# Patient Record
Sex: Male | Born: 1980 | Hispanic: Yes | Marital: Single | Smoking: Current every day smoker
Health system: Southern US, Community
[De-identification: ages and names within clinical notes are randomized; demographics above are authoritative.]

## PROBLEM LIST (undated history)

## (undated) DIAGNOSIS — M109 Gout, unspecified: Secondary | ICD-10-CM

## (undated) DIAGNOSIS — I219 Acute myocardial infarction, unspecified: Secondary | ICD-10-CM

## (undated) DIAGNOSIS — F32A Depression, unspecified: Secondary | ICD-10-CM

## (undated) DIAGNOSIS — F419 Anxiety disorder, unspecified: Secondary | ICD-10-CM

## (undated) DIAGNOSIS — E785 Hyperlipidemia, unspecified: Secondary | ICD-10-CM

## (undated) HISTORY — PX: LITHOTRIPSY: SUR834

## (undated) HISTORY — PX: ABSCESS DRAINAGE: SHX1119

---

## 2014-12-23 ENCOUNTER — Emergency Department
Admission: EM | Admit: 2014-12-23 | Discharge: 2014-12-23 | Disposition: A | Discharge status: Home / Self Care | Payer: Charity | Attending: Emergency Medicine | Admitting: Emergency Medicine

## 2014-12-23 ENCOUNTER — Emergency Department: Payer: Charity

## 2014-12-23 DIAGNOSIS — S61412A Laceration without foreign body of left hand, initial encounter: Secondary | ICD-10-CM | POA: Insufficient documentation

## 2014-12-23 DIAGNOSIS — F17200 Nicotine dependence, unspecified, uncomplicated: Secondary | ICD-10-CM | POA: Insufficient documentation

## 2014-12-23 DIAGNOSIS — W01198A Fall on same level from slipping, tripping and stumbling with subsequent striking against other object, initial encounter: Secondary | ICD-10-CM | POA: Insufficient documentation

## 2014-12-23 DIAGNOSIS — S61011A Laceration without foreign body of right thumb without damage to nail, initial encounter: Secondary | ICD-10-CM | POA: Insufficient documentation

## 2014-12-23 DIAGNOSIS — W25XXXA Contact with sharp glass, initial encounter: Secondary | ICD-10-CM

## 2014-12-23 DIAGNOSIS — Z23 Encounter for immunization: Secondary | ICD-10-CM | POA: Insufficient documentation

## 2014-12-23 HISTORY — DX: Gout, unspecified: M10.9

## 2014-12-23 MED ORDER — HYDROCODONE-ACETAMINOPHEN 5-325 MG PO TABS
1.0000 | ORAL_TABLET | Freq: Four times a day (QID) | ORAL | Status: DC | PRN
Start: 2014-12-23 — End: 2015-11-29

## 2014-12-23 MED ORDER — TETANUS-DIPHTH-ACELL PERTUSSIS 5-2.5-18.5 LF-MCG/0.5 IM SUSP
0.5000 mL | Freq: Once | INTRAMUSCULAR | Status: AC
Start: 2014-12-23 — End: 2014-12-23
  Administered 2014-12-23: 0.5 mL via INTRAMUSCULAR
  Filled 2014-12-23: qty 0.5

## 2014-12-23 NOTE — Discharge Instructions (Signed)
Laceration, Sutures    You have been treated for a laceration (cut).    Follow up with your doctor OR come back here OR go to the nearest Emergency Department to have your sutures (stitches) taken out. Sutures should be taken out in:   10 days.    Use the following wound care instructions:   Keep the wound clean and dry for the next 24 hours. You can wash the wound gently with soap and water.   DO NOT allow your wound to soak in water (don't do the dishes or go swimming, for example). You can shower, but do not rub your stitches too hard. Let the wound dry before putting another bandage on.   Take off old dressings every day. Then put on a clean, dry dressing.   If the bandage sticks to the wound, slightly moisten it with water. This way, it can come off more easily.   You can wash the wound gently with soap and water. To help remove a scab, cleanse the area with a mixture of half hydrogen peroxide and half water. This will also help [place] to take out the sutures later.   Allow the area to dry completely before putting on a new bandage.   Unless you receive instructions not to do so, you can place a thin layer of antibiotic ointment over the wound. You can buy Polysporin, Bacitracin, or Neosporin at the store. Neosporin can sometimes cause irritation to your skin. If this happens, stop using it and switch to another topical (surface) antibiotic.   If needed, put a clean, dry bandage over the wound to protect it.    Keep the affected area elevated (lifted) for the next 24 hours. This will decrease swelling and pain. You may also want to put ice on the area. By applying ice to the affected area, swelling and pain can be reduced. Place some ice cubes in a re-sealable (Ziploc) bag and add some water. Put a thin washcloth between the bag and the skin. Apply the ice bag to the area for at least 20 minutes. Do this at least 4 times per day. It is OK to use the ice more frequently and for longer periods of  time. DO NOT APPLY ICE DIRECTLY TO THE SKIN!    If you had a local anesthetic, it will wear off in about 2 hours. Until then, be careful not to hurt yourself because of having less feeling in the area.    YOU SHOULD SEEK MEDICAL ATTENTION IMMEDIATELY, EITHER HERE OR AT THE NEAREST EMERGENCY DEPARTMENT, IF ANY OF THE FOLLOWING OCCURS:   You see redness or swelling.   There are red streaks going up from the injured area.   The wound smells bad or has a lot of drainage.   You have fever (temperature higher than 100.51F / 38C), chills, worse pain and / or swelling.            Laceration, General Wound Care    Use the following wound care instructions for your laceration (cut):   Keep the wound clean and dry for the next 24 hours. Avoid excessive moisture. You can wash the wound gently with soap and water. Then apply a dry bandage.   DO NOT allow your wound to soak in water (don't do the dishes or go swimming, for example). You can shower, but do not rub your stitches too hard. Let the wound dry before putting another bandage on.   Take off old  dressings every day. Then put on a clean, dry dressing.   If the dressing sticks to the wound, slightly moisten it with water. This way, it can come off more easily.   To help remove a scab, cleanse the area with a mixture of half hydrogen peroxide and half water. This will also help [place] to take out the sutures when they are ready to be taken out.   Let the area dry thoroughly.   Unless you receive instructions not to do so, you can place a thin layer of antibiotic ointment over the wound. You can buy Polysporin, Bacitracin, or Neosporin at the store. Neosporin can sometimes cause irritation to your skin. If this happens, stop using it and switch to another topical (surface) antibiotic.   If needed, put a clean, dry bandage over the wound to protect it.    Keep the injured area elevated (lifted) for the next 24 hours. This will decrease swelling and pain. You  may also want to put ice on the area. Place some ice cubes in a re-sealable (Ziploc) bag and add some water. Put a thin washcloth between the bag and the skin. Apply the ice bag to the area for at least 20 minutes. Do this at least 4 times per day. It is okay to do this more often than directed. You can also do it for longer than directed. NEVER APPLY ICE DIRECTLY TO THE SKIN.    If you had a local anesthetic, it will wear off in about 2 hours. Until then, be careful not to hurt yourself because of having less feeling in the area.    Not all lacerations (cuts) will need antibiotics. Your doctor may have decided that you need antibiotics to prevent an infection. Be sure to fill the prescription and take all medicines as directed.    If your doctor gave you a prescription for pain medicine, fill the prescription and use the medicine as directed.    YOU SHOULD SEEK MEDICAL ATTENTION IMMEDIATELY, EITHER HERE OR AT THE NEAREST EMERGENCY DEPARTMENT, IF ANY OF THE FOLLOWING OCCURS:   You see redness or swelling.   There are red streaks or there is redness around the wound.   The wound smells bad or has a lot of drainage.   You have fever (temperature higher than 100.62F or 38C), chills, worse pain and / or swelling.    Tetanus Booster    You have been given an immunization booster shot against tetanus and diphtheria and possibly pertussis (if you received Tdap).    This shot should protect you from tetanus for at least 5 to10 years.    Tetanus is a rare disease (fewer than 50 cases per year). It can be completely prevented with immunizations. Almost any wound can be infected with tetanus, but it is more likely with certain types (crush wounds, deep puncture wounds, and contaminated or dirty wounds).    Remember to tell your primary care physician that you received the tetanus booster shot today so that the information can be entered into you medical records.    As with any immunization, you can expect some  discomfort and redness at the injection site.    YOU SHOULD SEEK MEDICAL ATTENTION IMMEDIATELY, EITHER HERE OR AT THE NEAREST EMERGENCY DEPARTMENT, IF ANY OF THE FOLLOWING OCCURS:   You develop a fever (temperature higher than 100.62F / 38C).   You have increased swelling, redness or pain.   You see red streaks starting up the arm.  [name] [name]  [id number]  [id number]  [phone]  12/23/2014    Discharge Instructions    As always, you are the most important factor in your recovery.  Please follow these instructions carefully.  If you have problems that we have not discussed, CALL OR VISIT YOUR DOCTOR RIGHT AWAY.     If you can't reach your doctor, return to the emergency department.    I Marge Duncans understand the written and discussed instructions.  My questions have been answered.  I acknowledge receipt of these instructions.     Patient or responsible person:         Patient's Signature               Physician or Nurse

## 2014-12-23 NOTE — ED Provider Notes (Signed)
[facility] EMERGENCY DEPARTMENT APP H&P         CLINICAL SUMMARY      THIS CHART HAS BEEN COMPLETED AND IS READY TO SIGN.      Diagnosis:    .     Final diagnoses:   Thumb laceration, right, initial encounter   Hand laceration, left, initial encounter   Injury from broken glass, initial encounter   Need for tetanus booster         MDM Notes:        MDM  Number of Diagnoses or Management Options  Hand laceration, left, initial encounter: new, needed workup  Injury from broken glass, initial encounter: new, needed workup  Need for tetanus booster:   Thumb laceration, right, initial encounter: new, needed workup     Amount and/or Complexity of Data Reviewed  Tests in the radiology section of CPT: ordered and reviewed  Discussion of test results with the performing providers: yes  Discuss the patient with other providers: yes  Independent visualization of images, tracings, or specimens: yes        Disposition:           ED Disposition     Discharge Marge Duncans discharge to home/self care.    Condition at disposition: Stable                      CLINICAL INFORMATION        HPI:      Chief Complaint: Laceration  .    Chanc [name] [name] is a 34 y.o. male who presents with c/o b/l hand lacerations after pt fell into a glass table tonight.  +bleeding and pain last night but did not want medical attention; awoke to day and realized he needed medical attention.  +open wounds; bleeding controlled w/ pressure dressings.  Pain minor; sharp; worse w/ palpation of wounds.     @Nursing  (triage) note reviewed for the following pertinent information:    bilateral hand lacerations from a broken glass table.  happened 2am.  multi abrasions and lacerations noted.  last Td unk      History obtained from: Patient      ROS:      Positive and negative ROS elements as per HPI.  All other systems reviewed and negative.      Physical Exam:      Pulse 88  BP 111/78 mmHg  Resp 18  SpO2 99 %  Temp 98.4 F (36.9  C)    Physical Exam   Constitutional: He is oriented to person, place, and time. He appears well-developed and well-nourished. No distress.   HENT:   Head: Normocephalic and atraumatic.   Right Ear: External ear normal.   Left Ear: External ear normal.   Eyes: Conjunctivae and EOM are normal. Pupils are equal, round, and reactive to light.   Cardiovascular: Normal rate, regular rhythm and intact distal pulses.    Pulmonary/Chest: Effort normal. No respiratory distress.   Musculoskeletal: Normal range of motion.        Left hand: He exhibits bony tenderness and laceration. He exhibits normal range of motion, normal capillary refill and no deformity. Decreased sensation noted.        Hands:  Neurological: He is alert and oriented to person, place, and time. He has normal strength. Coordination normal.   Skin: Skin is warm and dry. No rash noted. He is not diaphoretic. No erythema. No pallor.   Psychiatric: He has a normal mood  and affect. His behavior is normal. Judgment and thought content normal. His speech is not slurred. Cognition and memory are normal. He is communicative. He is attentive.   Nursing note and vitals reviewed.                  PAST HISTORY        Primary Care Provider: Christa See, MD        PMH/PSH:    .     Past Medical History   Diagnosis Date   . Gout        He has no past surgical history on file.      Social/Family History:      He reports that he has been smoking.  He does not have any smokeless tobacco history on file. He reports that he drinks alcohol. He reports that he does not use illicit drugs.    History reviewed. No pertinent family history.      Listed Medications on Arrival:    .     There are no discharge medications for this patient.     Allergies: He has No Known Allergies.            VISIT INFORMATION        Clinical Course in the ED:            Medications Given in the ED:    .     ED Medication Orders     Start Ordered     Status Ordering Provider    12/23/14 1043  12/23/14 1042  tetanus-diphth-acell pertussis (BOOSTRIX) injection 0.5 mL   Once     Route: Intramuscular  Ordered Dose: 0.5 mL     Last MAR action:  Given Velton Roselle L            Procedures:      Lac Repair  Date/Time: 12/24/2014 12:09 AM  Performed by: Tivis Ringer, Sohail Capraro L  Authorized by: Lenord Fellers    Consent:     Consent obtained:  Verbal    Consent given by:  Patient    Risks discussed:  Pain, infection, poor cosmetic result, poor wound healing, need for additional repair and nerve damage    Alternatives discussed:  No treatment  Anesthesia (see MAR for exact dosages):     Anesthesia method:  Local infiltration  Laceration details:     Location:  Hand    Hand location:  L palm    Length (cm):  3  Repair type:     Repair type:  Simple  Pre-procedure details:     Preparation:  Patient was prepped and draped in usual sterile fashion  Exploration:     Hemostasis achieved with:  Epinephrine and direct pressure    Wound exploration: wound explored through full range of motion and entire depth of wound probed and visualized      Wound extent: nerve damage    Treatment:     Area cleansed with:  Betadine    Amount of cleaning:  Standard    Irrigation solution:  Sterile saline    Irrigation method:  Pressure wash  Skin repair:     Repair method:  Sutures    Suture size:  5-0    Suture material:  Nylon    Suture technique:  Simple interrupted    Number of sutures:  4  Approximation:     Approximation:  Close    Vermilion border: well-aligned    Post-procedure details:  Dressing:  Bulky dressing, sterile dressing and antibiotic ointment    Patient tolerance of procedure:  Tolerated well, no immediate complications  Lac Repair  Date/Time: 12/24/2014 12:10 AM  Performed by: Tivis Ringer, Perry Brucato L  Authorized by: Lenord Fellers    Consent:     Consent obtained:  Verbal    Consent given by:  Patient    Risks discussed:  Infection, pain, poor cosmetic result and poor wound healing    Alternatives discussed:  No treatment  Anesthesia  (see MAR for exact dosages):     Anesthesia method:  Local infiltration    Local anesthetic:  Lidocaine 2% WITH epi  Laceration details:     Location:  Finger    Finger location:  R thumb    Length (cm):  1  Repair type:     Repair type:  Simple  Pre-procedure details:     Preparation:  Patient was prepped and draped in usual sterile fashion and imaging obtained to evaluate for foreign bodies  Exploration:     Hemostasis achieved with:  Epinephrine and direct pressure    Wound exploration: wound explored through full range of motion and entire depth of wound probed and visualized      Wound extent: muscle damage    Treatment:     Area cleansed with:  Betadine    Amount of cleaning:  Standard    Irrigation solution:  Sterile saline    Irrigation method:  Pressure wash  Skin repair:     Repair method:  Sutures    Suture size:  5-0    Suture material:  Nylon    Suture technique:  Simple interrupted    Number of sutures:  1  Approximation:     Approximation:  Close    Vermilion border: well-aligned    Post-procedure details:     Dressing:  Sterile dressing and antibiotic ointment    Patient tolerance of procedure:  Tolerated well, no immediate complications        Interpretations:                  RESULTS        Lab Results:      Results     ** No results found for the last 24 hours. **              Radiology Results:      XR Hand Right PA Lateral And Oblique   Final Result    No fracture or foreign body in either hand.      Bosie Helper, MD    12/23/2014 12:37 PM         Hand Left PA Lateral and Oblique   Final Result    No fracture or foreign body in either hand.      Bosie Helper, MD    12/23/2014 12:37 PM                     Scribe Attestation:      No scribe involved in the care of this patient                             Erick Blinks, PA  12/23/14 1216    Erick Blinks, [place]  12/24/14 [phone]

## 2014-12-23 NOTE — Special Discharge Instructions (Cosign Needed)
**   IT WILL BE IMPORTANT TO FOLLOW UP WITH A HAND SPECIALIST WITHIN THE NEXT WEEK TO EVALUATE YOUR NUMBNESS AND DETERMINE IF YOU HAVE A NERVE INJURY TO YOUR LEFT HAND. **

## 2014-12-23 NOTE — ED Provider Notes (Signed)
Attending Note:     The patient was seen and examined by the mid-level provider. I agree with the plan as it was presented to me.   - Casson Catena H. Joseph Art, MD    Diagnosis:   1. Thumb laceration, right, initial encounter    2. Hand laceration, left, initial encounter    3. Injury from broken glass, initial encounter    4. Need for tetanus booster      Disposition:   ED Disposition     Discharge Marge Duncans discharge to home/self care.    Condition at disposition: Stable                  Lenord Fellers, MD  12/26/14 [phone]

## 2015-01-03 ENCOUNTER — Emergency Department: Payer: Charity

## 2015-01-03 ENCOUNTER — Emergency Department
Admission: EM | Admit: 2015-01-03 | Discharge: 2015-01-03 | Disposition: A | Discharge status: Home / Self Care | Payer: Charity | Attending: Emergency Medicine | Admitting: Emergency Medicine

## 2015-01-03 DIAGNOSIS — M25561 Pain in right knee: Secondary | ICD-10-CM | POA: Insufficient documentation

## 2015-01-03 DIAGNOSIS — I10 Essential (primary) hypertension: Secondary | ICD-10-CM | POA: Insufficient documentation

## 2015-01-03 DIAGNOSIS — E119 Type 2 diabetes mellitus without complications: Secondary | ICD-10-CM | POA: Insufficient documentation

## 2015-01-03 DIAGNOSIS — Z4802 Encounter for removal of sutures: Secondary | ICD-10-CM

## 2015-01-03 DIAGNOSIS — K219 Gastro-esophageal reflux disease without esophagitis: Secondary | ICD-10-CM | POA: Insufficient documentation

## 2015-01-03 DIAGNOSIS — Z87891 Personal history of nicotine dependence: Secondary | ICD-10-CM | POA: Insufficient documentation

## 2015-01-03 DIAGNOSIS — Z7982 Long term (current) use of aspirin: Secondary | ICD-10-CM | POA: Insufficient documentation

## 2015-01-03 DIAGNOSIS — W010XXA Fall on same level from slipping, tripping and stumbling without subsequent striking against object, initial encounter: Secondary | ICD-10-CM | POA: Insufficient documentation

## 2015-01-03 DIAGNOSIS — M179 Osteoarthritis of knee, unspecified: Secondary | ICD-10-CM | POA: Insufficient documentation

## 2015-01-03 NOTE — ED Provider Notes (Signed)
EMERGENCY DEPARTMENT HISTORY AND PHYSICAL EXAM    Date: 01/03/2015  Patient Name: [name] [name]  Attending Physician: Brooke Bonito, DO      History of Presenting Illness     Chief Complaint   Patient presents with   . Suture / Staple Removal       History Provided By: patient    Chief Complaint: here for suture removal from R hand  Onset: 11 days  Timing: s/p cutting on glass  Location: right hand second webbed space  Quality: ache, "tender"  Severity: moderate  Modifying Factors:     Additional History: [name] [name] is a 34 y.o. male here today for suture removal. Had sutures placed at [facility] 11 days ago. Admits he still has some tenderness to laceration between webbed space of R hand. All other sutures have fallen out already.     PCP: Pcp, Noneorunknown, MD    No current facility-administered medications for this encounter.     Current Outpatient Prescriptions   Medication Sig Dispense Refill   . HYDROcodone-acetaminophen (NORCO) 5-325 MG per tablet Take 1 tablet by mouth every 6 (six) hours as needed for Pain. 20 tablet 0         Past Medical History     Past Medical History   Diagnosis Date   . Gout      History reviewed. No pertinent past surgical history.    Family History     History reviewed. No pertinent family history.      Social History     Social History   Substance Use Topics   . Smoking status: Current Every Day Smoker   . Smokeless tobacco: None   . Alcohol Use: Yes      Comment: social         Allergies     No Known Allergies    Review of Systems     Review of Systems   Constitutional: Negative for fever and chills.   Skin: Negative for itching and rash.        + bilateral hand lacerations   Neurological: Negative for tingling and sensory change.         Physical Exam   BP 129/90 mmHg  Pulse 80  Temp(Src) 98 F (36.7 C) (Oral)  Resp 18  Ht 5\' 11"  (1.803 m)  Wt 81.647 kg  BMI 25.12 kg/m2  SpO2 100%    Physical Exam   Constitutional: He is oriented to person, place, and time. He appears  well-developed and well-nourished.   HENT:   Head: Normocephalic and atraumatic.   Right Ear: External ear normal.   Left Ear: External ear normal.   Eyes: Conjunctivae and EOM are normal. Right eye exhibits no discharge. Left eye exhibits no discharge. No scleral icterus.   Neck: Normal range of motion.   Musculoskeletal: Normal range of motion. He exhibits no edema, tenderness or deformity.   Neurological: He is alert and oriented to person, place, and time.   Skin: Skin is warm and dry. No rash noted. He is not diaphoretic. No erythema. No pallor.   1cm laceration with sutures in place to second webbed space of R hand, mild scabbing. Well approximated without erythema or streaking. Mildly ttp.    Psychiatric: He has a normal mood and affect. His behavior is normal. Judgment and thought content normal.   Nursing note and vitals reviewed.        Procedures       Diagnostic Study Results  Labs -     Results     ** No results found for the last 24 hours. **          Radiologic Studies -   Radiology Results (24 Hour)     ** No results found for the last 24 hours. **      .    Clinical Course in the Emergency Department     ED Course:      Medical Decision Making   I am the first provider for this patient.    I reviewed the vital signs, available nursing notes, past medical history, past surgical history, family history and social history.    Vital Signs-Reviewed the patient's vital signs.     Patient Vitals for the past 12 hrs:   BP Temp Pulse Resp   01/03/15 1443 129/90 mmHg 98 F (36.7 C) 80 18       Pulse Oximetry Analysis - Normal 100% on RA    Provider Notes:   Sutures removed by EMT. Continued wound care discussed. All questions answered with pt.     Diagnosis and Treatment Plan       Clinical Impression:   1. Visit for suture removal        _______________________________          Effie Berkshire, PA  01/03/15 1505    Brooke Bonito, DO  01/03/15 1514

## 2015-01-03 NOTE — ED Notes (Signed)
[name] [name] is a 34 y.o. male here for suture removal to R thumb from 7/31. Healing appropriately.

## 2015-01-03 NOTE — Discharge Instructions (Signed)
Thank you for choosing [facility] for your emergency care needs.   We strive to provide EXCELLENT care to you and your family.      If you do not continue to improve or your condition worsens, please contact your doctor or return immediately to the Emergency Department.    DOCTOR REFERRALS  Call [phone] if you need any further referrals and we can help you find a primary care doctor or specialist.  Also, available online at:  [email or web address]      FREE HEALTH SERVICES  If you need help with health or social services, please call 2-1-1 for a free referral to resources in your area.  2-1-1 is a free service connecting people with information on health insurance, free clinics, pregnancy, mental health, dental care, food assistance, housing, and substance abuse counseling.  Also, available online at:  [email or web address]    MEDICAL RECORDS AND TESTS  Certain laboratory test results do not come back the same day, for example urine cultures.   We will contact you if other important findings are noted.  Radiology films are often reviewed again to ensure accuracy.  If there is any discrepancy, we will notify you.      ORTHOPEDIC INJURY   Please know that significant injuries can exist even when an initial x-ray is read as normal or negative.  This can occur because some fractures (broken bones) are not initially visible on x-rays.  For this reason, close outpatient follow-up with your primary care doctor or bone specialist (orthopedist) is required.    MEDICATIONS AND FOLLOWUP  Please be aware that some prescription medications can cause drowsiness.  Use caution when driving or operating machinery.    The examination and treatment you have received in our Emergency Department is provided on an emergency basis, and is not intended to be a substitute for your primary care physician.  It is important that your doctor checks you again and that you report any new or remaining  problems at that time.      Tylenol (acetaminophen) or Motrin (Advil, ibuprofen) as needed for pain/fever      Suture/Staple Removal    You have been seen for getting your sutures (stitches) or surgical staples taken out.    Your wound has healed. The sutures/staples have been taken out. However, the wound may still separate (reopen) if struck (hit). Keep protecting the wound to avoid further injury.    Keep putting a thin layer of antibiotic ointment on the wound. This reduces scarring and prevents infection. New scars are more likely to darken in the sun. Be sure to put sunscreen on regularly.    In general, you can choose any antibiotic ointment or cream. However, Neosporin can be irritating to the skin and cause a rash. If this happens, stop using Neosporin. Switch to a "triple antibiotic cream" (also called Polysporin). This may irritate your skin much less.    Keep the wound clean and dry for the next 24 hours. Don t let it get too wet.    YOU SHOULD SEEK MEDICAL ATTENTION IMMEDIATELY, EITHER HERE OR AT THE NEAREST EMERGENCY DEPARTMENT, IF ANY OF THE FOLLOWING OCCURS:   Unusual redness or swelling.   Red streaks going up the arm or leg.   The wound smells bad or has a lot of drainage.   Pain when moving the extremity (arm or leg) or swollen lymph nodes. These are bumps normally found in the groin, armpit and neck.   Fever (  temperature higher than 100.4F / 38C), chills, more pain or swelling.

## 2015-11-28 ENCOUNTER — Emergency Department: Payer: Charity

## 2015-11-28 ENCOUNTER — Inpatient Hospital Stay
Admission: EM | Admit: 2015-11-28 | Discharge: 2015-12-01 | DRG: 872 | Disposition: A | Discharge status: Home / Self Care | Payer: Charity | Attending: Internal Medicine | Admitting: Internal Medicine

## 2015-11-28 ENCOUNTER — Inpatient Hospital Stay: Payer: Charity | Admitting: Internal Medicine

## 2015-11-28 DIAGNOSIS — A419 Sepsis, unspecified organism: Secondary | ICD-10-CM | POA: Diagnosis present

## 2015-11-28 DIAGNOSIS — N10 Acute tubulo-interstitial nephritis: Secondary | ICD-10-CM | POA: Diagnosis present

## 2015-11-28 DIAGNOSIS — N1330 Unspecified hydronephrosis: Secondary | ICD-10-CM | POA: Diagnosis present

## 2015-11-28 DIAGNOSIS — F17200 Nicotine dependence, unspecified, uncomplicated: Secondary | ICD-10-CM | POA: Diagnosis present

## 2015-11-28 DIAGNOSIS — Z9889 Other specified postprocedural states: Secondary | ICD-10-CM

## 2015-11-28 DIAGNOSIS — M109 Gout, unspecified: Secondary | ICD-10-CM | POA: Diagnosis present

## 2015-11-28 DIAGNOSIS — N200 Calculus of kidney: Secondary | ICD-10-CM | POA: Diagnosis present

## 2015-11-28 DIAGNOSIS — N131 Hydronephrosis with ureteral stricture, not elsewhere classified: Secondary | ICD-10-CM

## 2015-11-28 DIAGNOSIS — I878 Other specified disorders of veins: Secondary | ICD-10-CM | POA: Diagnosis present

## 2015-11-28 LAB — COMPREHENSIVE METABOLIC PANEL
ALT: 31 U/L (ref 0–55)
AST (SGOT): 44 U/L — ABNORMAL HIGH (ref 5–34)
Albumin/Globulin Ratio: 0.7 — ABNORMAL LOW (ref 0.9–2.2)
Albumin: 2.8 g/dL — ABNORMAL LOW (ref 3.5–5.0)
Alkaline Phosphatase: 76 U/L (ref 38–106)
Anion Gap: 11 (ref 5.0–15.0)
BUN: 11 mg/dL (ref 9–28)
Bilirubin, Total: 1.8 mg/dL — ABNORMAL HIGH (ref 0.2–1.2)
CO2: 26 mEq/L (ref 22–29)
Calcium: 8.3 mg/dL — ABNORMAL LOW (ref 8.5–10.5)
Chloride: 92 mEq/L — ABNORMAL LOW (ref 100–111)
Creatinine: 1.1 mg/dL (ref 0.7–1.3)
Globulin: 4.1 g/dL — ABNORMAL HIGH (ref 2.0–3.6)
Glucose: 107 mg/dL — ABNORMAL HIGH (ref 70–100)
Potassium: 3.8 mEq/L (ref 3.5–5.1)
Protein, Total: 6.9 g/dL (ref 6.0–8.3)
Sodium: 129 mEq/L — ABNORMAL LOW (ref 136–145)

## 2015-11-28 LAB — URINALYSIS, REFLEX TO MICROSCOPIC EXAM IF INDICATED
Bilirubin, UA: NEGATIVE
Glucose, UA: NEGATIVE
Ketones UA: NEGATIVE
Nitrite, UA: NEGATIVE
Protein, UR: 100 — AB
Specific Gravity UA: 1.012 (ref 1.001–1.035)
Urine pH: 6 (ref 5.0–8.0)
Urobilinogen, UA: 2 mg/dL (ref 0.2–2.0)

## 2015-11-28 LAB — CBC AND DIFFERENTIAL
Basophils Absolute Automated: 0.02 10*3/uL (ref 0.00–0.20)
Basophils Automated: 0.1 %
Eosinophils Absolute Automated: 0 10*3/uL (ref 0.00–0.70)
Eosinophils Automated: 0 %
Hematocrit: 43.8 % (ref 42.0–52.0)
Hgb: 15.3 g/dL (ref 13.0–17.0)
Immature Granulocytes Absolute: 0.1 10*3/uL — ABNORMAL HIGH
Immature Granulocytes: 0.5 %
Lymphocytes Absolute Automated: 0.9 10*3/uL (ref 0.50–4.40)
Lymphocytes Automated: 4.8 %
MCH: 33.9 pg — ABNORMAL HIGH (ref 28.0–32.0)
MCHC: 34.9 g/dL (ref 32.0–36.0)
MCV: 97.1 fL (ref 80.0–100.0)
MPV: 9.1 fL — ABNORMAL LOW (ref 9.4–12.3)
Monocytes Absolute Automated: 2.63 10*3/uL — ABNORMAL HIGH (ref 0.00–1.20)
Monocytes: 14 %
Neutrophils Absolute: 15.25 10*3/uL — ABNORMAL HIGH (ref 1.80–8.10)
Neutrophils: 81.1 %
Nucleated RBC: 0 /100 WBC (ref 0.0–1.0)
Platelets: 189 10*3/uL (ref 140–400)
RBC: 4.51 10*6/uL — ABNORMAL LOW (ref 4.70–6.00)
RDW: 13 % (ref 12–15)
WBC: 18.8 10*3/uL — ABNORMAL HIGH (ref 3.50–10.80)

## 2015-11-28 LAB — PT AND APTT
PT INR: 1.2 — ABNORMAL HIGH (ref 0.9–1.1)
PT: 14.9 s (ref 12.6–15.0)
PTT: 34 s (ref 23–37)

## 2015-11-28 LAB — LIPASE: Lipase: 10 U/L (ref 8–78)

## 2015-11-28 LAB — HIV AG/AB 4TH GENERATION: HIV Ag/Ab, 4th Generation: NONREACTIVE

## 2015-11-28 LAB — LACTIC ACID, PLASMA: Lactic Acid: 1.2 mmol/L (ref 0.2–2.0)

## 2015-11-28 LAB — GFR: EGFR: >60

## 2015-11-28 MED ORDER — SODIUM CHLORIDE 0.9 % IV MBP
1.0000 g | Freq: Two times a day (BID) | INTRAVENOUS | Status: DC
Start: 2015-11-29 — End: 2015-12-01
  Administered 2015-11-29 – 2015-12-01 (×5): 1 g via INTRAVENOUS
  Filled 2015-11-28 (×8): qty 1000

## 2015-11-28 MED ORDER — SODIUM CHLORIDE 0.9 % IV BOLUS
1000.0000 mL | Freq: Once | INTRAVENOUS | Status: DC
Start: 2015-11-28 — End: 2015-11-28
  Administered 2015-11-28: 1000 mL via INTRAVENOUS

## 2015-11-28 MED ORDER — MORPHINE SULFATE 2 MG/ML IJ/IV SOLN (WRAP)
2.0000 mg | Status: DC | PRN
Start: 2015-11-28 — End: 2015-11-29
  Administered 2015-11-29: 2 mg via INTRAVENOUS
  Filled 2015-11-28: qty 1

## 2015-11-28 MED ORDER — DEXTROSE-SODIUM CHLORIDE 5-0.9 % IV SOLN
INTRAVENOUS | Status: DC
Start: 2015-11-28 — End: 2015-12-01
  Administered 2015-11-28 – 2015-11-29 (×2): 150 mL/h via INTRAVENOUS

## 2015-11-28 MED ORDER — SODIUM CHLORIDE 0.9 % IV MBP
1.0000 g | Freq: Once | INTRAVENOUS | Status: AC
Start: 2015-11-28 — End: 2015-11-28
  Administered 2015-11-28: 1 g via INTRAVENOUS
  Filled 2015-11-28: qty 1000

## 2015-11-28 MED ORDER — ONDANSETRON HCL 4 MG/2ML IJ SOLN
4.0000 mg | Freq: Four times a day (QID) | INTRAMUSCULAR | Status: DC | PRN
Start: 2015-11-28 — End: 2015-12-01
  Administered 2015-11-29: 4 mg via INTRAVENOUS

## 2015-11-28 MED ORDER — SODIUM CHLORIDE 0.9 % IV BOLUS
30.0000 mL/kg | Freq: Once | INTRAVENOUS | Status: AC
Start: 2015-11-28 — End: 2015-11-28
  Administered 2015-11-28: 2328 mL via INTRAVENOUS

## 2015-11-28 MED ORDER — ACETAMINOPHEN 325 MG PO TABS
650.0000 mg | ORAL_TABLET | Freq: Four times a day (QID) | ORAL | Status: DC | PRN
Start: 2015-11-28 — End: 2015-12-01
  Administered 2015-11-29 (×2): 650 mg via ORAL
  Filled 2015-11-28 (×2): qty 2

## 2015-11-28 MED ORDER — ONDANSETRON 4 MG PO TBDP
4.0000 mg | ORAL_TABLET | Freq: Four times a day (QID) | ORAL | Status: DC | PRN
Start: 2015-11-28 — End: 2015-12-01

## 2015-11-28 MED ORDER — ONDANSETRON HCL 4 MG/2ML IJ SOLN
4.0000 mg | Freq: Once | INTRAMUSCULAR | Status: AC
Start: 2015-11-28 — End: 2015-11-28
  Administered 2015-11-28: 4 mg via INTRAVENOUS
  Filled 2015-11-28: qty 2

## 2015-11-28 MED ORDER — ACETAMINOPHEN 325 MG PO TABS
650.0000 mg | ORAL_TABLET | Freq: Once | ORAL | Status: AC
Start: 2015-11-28 — End: 2015-11-28
  Administered 2015-11-28: 650 mg via ORAL
  Filled 2015-11-28: qty 2

## 2015-11-28 MED ORDER — SODIUM CHLORIDE 0.9 % IV BOLUS
1000.0000 mL | Freq: Once | INTRAVENOUS | Status: AC
Start: 2015-11-28 — End: 2015-11-28
  Administered 2015-11-28: 1000 mL via INTRAVENOUS

## 2015-11-28 MED ORDER — NALOXONE HCL 0.4 MG/ML IJ SOLN (WRAP)
0.2000 mg | INTRAMUSCULAR | Status: DC | PRN
Start: 2015-11-28 — End: 2015-11-29

## 2015-11-28 NOTE — H&P (Signed)
HISTORY AND PHYSICAL EXAM      Date Time: 11/28/2015 9:17 PM  Patient Name: [name] [name],[name] [name]  Chief Complaint: abd pain    History of Present Illness:   Dajohn Ellender is a 35 y.o. male who presents to the hospital with 1 day of abdominal pain and nausea. CT in ED revealed 7mm L renal calculus and moderate hydronephrosis. Pt has had mild chills over past few days. He note hx of gout, not current active, but no prior renal stones. He otherwise feels well, without dizziness, CP, or ongoing fever. ED reports consult from Urology and referral to IR for possible percutaneous drain given size of stone.     Past Medical History:     Past Medical History   Diagnosis Date   . Gout        Past Surgical History:   History reviewed. No pertinent past surgical history.    Family History:   No family history on file.    Social History:     Social History     Social History   . Marital Status: Single     Spouse Name: N/A   . Number of Children: N/A   . Years of Education: N/A     Social History Main Topics   . Smoking status: Current Every Day Smoker   . Smokeless tobacco: Not on file   . Alcohol Use: Yes      Comment: social   . Drug Use: No   . Sexual Activity: Not on file     Other Topics Concern   . Not on file     Social History Narrative       Allergies:   No Known Allergies    Medications:     Prior to Admission medications    Medication Sig Start Date End Date Taking? Authorizing Provider   HYDROcodone-acetaminophen (NORCO) 5-325 MG per tablet Take 1 tablet by mouth every 6 (six) hours as needed for Pain. 12/23/14   Erick Blinks, PA       Review of Systems:   General ROS: negative  ENT ROS: negative  Cardiovascular ROS: no chest pain or dyspnea on exertion  Respiratory ROS: no cough, shortness of breath, or wheezing  Gastrointestinal ROS: positive for - abdominal pain  Genito-Urinary ROS: no dysuria, trouble voiding, or hematuria  Hematological and Lymphatic ROS: negative  Endocrine ROS: negative  Musculoskeletal ROS:  negative  Neurological ROS: no TIA or stroke symptoms     Physical Exam:     Filed Vitals:    11/28/15 2034   BP: 92/53   Pulse: 66   Temp: 98.1 F (36.7 C)   Resp: 18   SpO2: 95%       Body mass index is 23.87 kg/(m^2).    Intake and Output Summary (Last 24 hours) at Date Time    Intake/Output Summary (Last 24 hours) at 11/28/15 2117  Last data filed at 11/28/15 1835   Gross per 24 hour   Intake    100 ml   Output      0 ml   Net    100 ml       General appearance - alert, well appearing, and in no distress  Mental status - alert, oriented to person, place, and time  Eyes - pupils equal and reactive, extraocular eye movements intact  Chest - clear to auscultation, no wheezes, rales or rhonchi, symmetric air entry  Heart - normal rate, regular rhythm, normal S1, S2,  no murmurs, rubs, clicks or gallops  Abdomen - soft, nontender, nondistended, no masses or organomegaly  Neurological - alert, oriented, normal speech, no focal findings or movement disorder noted  Extremities - peripheral pulses normal, no pedal edema, no clubbing or cyanosis  Skin - normal coloration and turgor, no rashes, no suspicious skin lesions noted    Labs:       Recent Labs  Lab 11/28/15  1631   SODIUM 129*   POTASSIUM 3.8   CHLORIDE 92*   CO2 26   BUN 11   CREATININE 1.1   EGFR >60.0   GLUCOSE 107*   CALCIUM 8.3*           Recent Labs  Lab 11/28/15  1631   WBC 18.80*   HGB 15.3   PLATELETS 189       Recent Labs  Lab 11/28/15  1631   ALT 31   AST (SGOT) 44*   BILIRUBIN, TOTAL 1.8*   ALKALINE PHOSPHATASE 76       Recent Labs  Lab 11/28/15  1630   PT INR 1.2*                         Diagnostics:   CT scan: 1. Suspect 7 mm distal left renal calculus just proximal to the UVJ with  moderate left hydration process.  2. Right perinephric stranding of uncertain etiology. No right ureteral  calculus.      Problem List:     Active Hospital Problems    Diagnosis   . Nephrolithiasis       Assessment:   35 y.o. male with hx of gout, obstructive  nephrolithiasis    Plan:   INR pending, update IR for determination when result available  NPO pending procedure  IV hydration    DVT prophylaxis Hold pending IR procedure  Code Status Full      For concerns from 7am-7pm, please page attending of record  For overnight emergencies, please page the Nocturnist through [employer] paging [phone]  Signed by: Rhae Hammock

## 2015-11-28 NOTE — ED Notes (Signed)
Family at bedside. 

## 2015-11-28 NOTE — ED Notes (Signed)
A/ox4, ambulatory c/o fever, HA, chills x 3-4 days. Pt was seen yesterday at [facility]gent [facility] Dx: Kidney infection. was prescribed ABX pt states he started taking them, but he is feeling worse.

## 2015-11-28 NOTE — ED Notes (Signed)
Placed pt's fluids on a pressure bag.

## 2015-11-28 NOTE — ED Notes (Signed)
MD at bedside. 

## 2015-11-28 NOTE — ED Notes (Signed)
Called lab to inquire about blood cultures, found in extra rack.

## 2015-11-29 ENCOUNTER — Inpatient Hospital Stay: Payer: Charity

## 2015-11-29 ENCOUNTER — Inpatient Hospital Stay: Payer: Charity | Admitting: Anesthesiology

## 2015-11-29 ENCOUNTER — Encounter
Admission: EM | Disposition: A | Discharge status: Home / Self Care | Payer: Self-pay | Source: Home / Self Care | Attending: Internal Medicine

## 2015-11-29 ENCOUNTER — Encounter (INDEPENDENT_AMBULATORY_CARE_PROVIDER_SITE_OTHER): Payer: Self-pay

## 2015-11-29 DIAGNOSIS — I959 Hypotension, unspecified: Secondary | ICD-10-CM | POA: Diagnosis present

## 2015-11-29 LAB — BASIC METABOLIC PANEL
Anion Gap: 5 (ref 5.0–15.0)
BUN: 11 mg/dL (ref 9–28)
CO2: 26 mEq/L (ref 22–29)
Calcium: 7.5 mg/dL — ABNORMAL LOW (ref 8.5–10.5)
Chloride: 109 mEq/L (ref 100–111)
Creatinine: 0.9 mg/dL (ref 0.7–1.3)
Glucose: 113 mg/dL — ABNORMAL HIGH (ref 70–100)
Potassium: 3.5 mEq/L (ref 3.5–5.1)
Sodium: 140 mEq/L (ref 136–145)

## 2015-11-29 LAB — CBC AND DIFFERENTIAL
Basophils Absolute Automated: 0.03 10*3/uL (ref 0.00–0.20)
Basophils Automated: 0.2 %
Eosinophils Absolute Automated: 0.02 10*3/uL (ref 0.00–0.70)
Eosinophils Automated: 0.1 %
Hematocrit: 38.7 % — ABNORMAL LOW (ref 42.0–52.0)
Hgb: 13.2 g/dL (ref 13.0–17.0)
Immature Granulocytes Absolute: 0.03 10*3/uL
Immature Granulocytes: 0.2 %
Lymphocytes Absolute Automated: 1.49 10*3/uL (ref 0.50–4.40)
Lymphocytes Automated: 11.1 %
MCH: 33.2 pg — ABNORMAL HIGH (ref 28.0–32.0)
MCHC: 34.1 g/dL (ref 32.0–36.0)
MCV: 97.5 fL (ref 80.0–100.0)
MPV: 8.9 fL — ABNORMAL LOW (ref 9.4–12.3)
Monocytes Absolute Automated: 1.83 10*3/uL — ABNORMAL HIGH (ref 0.00–1.20)
Monocytes: 13.6 %
Neutrophils Absolute: 10.04 10*3/uL — ABNORMAL HIGH (ref 1.80–8.10)
Neutrophils: 75 %
Nucleated RBC: 0 /100 WBC (ref 0.0–1.0)
Platelets: 169 10*3/uL (ref 140–400)
RBC: 3.97 10*6/uL — ABNORMAL LOW (ref 4.70–6.00)
RDW: 13 % (ref 12–15)
WBC: 13.41 10*3/uL — ABNORMAL HIGH (ref 3.50–10.80)

## 2015-11-29 LAB — LACTIC ACID, PLASMA
Lactic Acid: 0.9 mmol/L (ref 0.2–2.0)
Lactic Acid: 1.4 mmol/L (ref 0.2–2.0)

## 2015-11-29 LAB — GFR: EGFR: >60

## 2015-11-29 SURGERY — PERC NEPH TUBE PLACEMENT
Anesthesia: IV Sedation | Laterality: Left

## 2015-11-29 SURGERY — CYSTOSCOPY, URETEROSCOPY, LASER LITHOTRIPSY
Anesthesia: Anesthesia General | Site: Pelvis | Laterality: Left | Wound class: Clean

## 2015-11-29 MED ORDER — SODIUM CHLORIDE 0.9 % IV BOLUS
1000.0000 mL | Freq: Once | INTRAVENOUS | Status: AC
Start: 2015-11-29 — End: 2015-11-29
  Administered 2015-11-29: 1000 mL via INTRAVENOUS

## 2015-11-29 MED ORDER — FENTANYL CITRATE (PF) 50 MCG/ML IJ SOLN (WRAP)
INTRAMUSCULAR | Status: AC
Start: 2015-11-29 — End: ?
  Filled 2015-11-29: qty 2

## 2015-11-29 MED ORDER — SUCCINYLCHOLINE CHLORIDE 20 MG/ML IJ SOLN
INTRAMUSCULAR | Status: DC | PRN
Start: 2015-11-29 — End: 2015-11-29
  Administered 2015-11-29: 120 mg via INTRAVENOUS

## 2015-11-29 MED ORDER — LACTATED RINGERS IV SOLN
INTRAVENOUS | Status: DC
Start: 2015-11-29 — End: 2015-12-01

## 2015-11-29 MED ORDER — FENTANYL CITRATE (PF) 50 MCG/ML IJ SOLN (WRAP)
50.0000 ug | INTRAMUSCULAR | Status: DC | PRN
Start: 2015-11-29 — End: 2015-11-29

## 2015-11-29 MED ORDER — ONDANSETRON HCL 4 MG/2ML IJ SOLN
4.0000 mg | Freq: Once | INTRAMUSCULAR | Status: DC | PRN
Start: 2015-11-29 — End: 2015-11-29

## 2015-11-29 MED ORDER — PROPOFOL 10 MG/ML IV EMUL (WRAP)
INTRAVENOUS | Status: DC | PRN
Start: 2015-11-29 — End: 2015-11-29
  Administered 2015-11-29: 200 mg via INTRAVENOUS
  Administered 2015-11-29 (×2): 50 mg via INTRAVENOUS

## 2015-11-29 MED ORDER — MIDAZOLAM HCL 2 MG/2ML IJ SOLN
INTRAMUSCULAR | Status: DC | PRN
Start: 2015-11-29 — End: 2015-11-29
  Administered 2015-11-29: 2 mg via INTRAVENOUS

## 2015-11-29 MED ORDER — DEXAMETHASONE SODIUM PHOSPHATE 4 MG/ML IJ SOLN (WRAP)
INTRAMUSCULAR | Status: DC | PRN
Start: 2015-11-29 — End: 2015-11-29
  Administered 2015-11-29: 4 mg via INTRAVENOUS

## 2015-11-29 MED ORDER — PHENAZOPYRIDINE HCL 100 MG PO TABS
100.0000 mg | ORAL_TABLET | Freq: Three times a day (TID) | ORAL | Status: DC
Start: 2015-11-29 — End: 2015-11-29

## 2015-11-29 MED ORDER — MIDAZOLAM HCL 2 MG/2ML IJ SOLN
INTRAMUSCULAR | Status: AC
Start: 2015-11-29 — End: 2015-11-29
  Filled 2015-11-29: qty 4

## 2015-11-29 MED ORDER — DIPHENHYDRAMINE HCL 50 MG/ML IJ SOLN
INTRAMUSCULAR | Status: DC
Start: 2015-11-29 — End: 2015-12-02
  Filled 2015-11-29: qty 1

## 2015-11-29 MED ORDER — OXYCODONE-ACETAMINOPHEN 5-325 MG PO TABS
1.0000 | ORAL_TABLET | Freq: Four times a day (QID) | ORAL | Status: DC | PRN
Start: 2015-11-29 — End: 2015-11-29

## 2015-11-29 MED ORDER — PROPOFOL 10 MG/ML IV EMUL (WRAP)
INTRAVENOUS | Status: AC
Start: 2015-11-29 — End: ?
  Filled 2015-11-29: qty 20

## 2015-11-29 MED ORDER — LACTATED RINGERS IV SOLN
INTRAVENOUS | Status: DC | PRN
Start: 2015-11-29 — End: 2015-11-29

## 2015-11-29 MED ORDER — LIDOCAINE(URO-JET) 2% JELLY (WRAP)
CUTANEOUS | Status: AC
Start: 2015-11-29 — End: ?
  Filled 2015-11-29: qty 1

## 2015-11-29 MED ORDER — DEXAMETHASONE SODIUM PHOSPHATE 10 MG/ML IJ SOLN
INTRAMUSCULAR | Status: AC
Start: 2015-11-29 — End: ?
  Filled 2015-11-29: qty 1

## 2015-11-29 MED ORDER — MIDAZOLAM HCL 2 MG/2ML IJ SOLN
INTRAMUSCULAR | Status: AC
Start: 2015-11-29 — End: ?
  Filled 2015-11-29: qty 2

## 2015-11-29 MED ORDER — HYDROMORPHONE HCL 1 MG/ML IJ SOLN
0.5000 mg | INTRAMUSCULAR | Status: DC | PRN
Start: 2015-11-29 — End: 2015-11-29

## 2015-11-29 MED ORDER — ONDANSETRON HCL 4 MG/2ML IJ SOLN
INTRAMUSCULAR | Status: AC
Start: 2015-11-29 — End: ?
  Filled 2015-11-29: qty 2

## 2015-11-29 MED ORDER — ROCURONIUM BROMIDE 50 MG/5ML IV SOLN
INTRAVENOUS | Status: AC
Start: 2015-11-29 — End: ?
  Filled 2015-11-29: qty 5

## 2015-11-29 MED ORDER — FENTANYL CITRATE (PF) 50 MCG/ML IJ SOLN (WRAP)
INTRAMUSCULAR | Status: DC
Start: 2015-11-29 — End: 2015-12-02
  Filled 2015-11-29: qty 4

## 2015-11-29 MED ORDER — LIDOCAINE HCL 2 % EX GEL
CUTANEOUS | Status: DC | PRN
Start: 2015-11-29 — End: 2015-11-29
  Administered 2015-11-29: 10 mL

## 2015-11-29 MED ORDER — ROCURONIUM BROMIDE 10 MG/ML IV SOLN (WRAP)
INTRAVENOUS | Status: DC | PRN
Start: 2015-11-29 — End: 2015-11-29
  Administered 2015-11-29: 5 mg via INTRAVENOUS

## 2015-11-29 MED ORDER — IOTHALAMATE MEGLUMINE 43 % IV SOLN
INTRAVENOUS | Status: DC | PRN
Start: 2015-11-29 — End: 2015-11-29
  Administered 2015-11-29: 10 mL via INTRAVENOUS

## 2015-11-29 MED ORDER — LIDOCAINE HCL (PF) 2 % IJ SOLN
INTRAMUSCULAR | Status: AC
Start: 2015-11-29 — End: ?
  Filled 2015-11-29: qty 5

## 2015-11-29 MED ORDER — LIDOCAINE HCL 2 % IJ SOLN
INTRAMUSCULAR | Status: DC | PRN
Start: 2015-11-29 — End: 2015-11-29
  Administered 2015-11-29: 60 mg via INTRAVENOUS
  Administered 2015-11-29: 40 mg via INTRAVENOUS

## 2015-11-29 MED ORDER — EPHEDRINE SULFATE 50 MG/ML IJ/IV SOLN (WRAP)
Status: AC
Start: 2015-11-29 — End: ?
  Filled 2015-11-29: qty 1

## 2015-11-29 MED ORDER — TAMSULOSIN HCL 0.4 MG PO CAPS
0.4000 mg | ORAL_CAPSULE | Freq: Once | ORAL | Status: DC
Start: 2015-11-29 — End: 2015-11-29

## 2015-11-29 MED ORDER — SUCCINYLCHOLINE CHLORIDE 20 MG/ML IJ SOLN
INTRAMUSCULAR | Status: AC
Start: 2015-11-29 — End: ?
  Filled 2015-11-29: qty 10

## 2015-11-29 MED ORDER — FENTANYL CITRATE (PF) 50 MCG/ML IJ SOLN (WRAP)
INTRAMUSCULAR | Status: DC | PRN
Start: 2015-11-29 — End: 2015-11-29
  Administered 2015-11-29: 25 ug via INTRAVENOUS
  Administered 2015-11-29: 100 ug via INTRAVENOUS

## 2015-11-29 MED ORDER — PHENYLEPHRINE HCL 10 MG/ML IV SOLN (WRAP)
Status: DC | PRN
Start: 2015-11-29 — End: 2015-11-29
  Administered 2015-11-29 (×2): 80 ug via INTRAVENOUS

## 2015-11-29 SURGICAL SUPPLY — 37 items
BAG URINARY DRN MONO FLO (Procedure Accessories) IMPLANT
BASKET STON RTRVL NTNL 0TP 12MM 1.9FR (Endoscopic Supplies)
BASKET STONE RETRIEVAL L120 CM OD12 MM (Endoscopic Supplies)
BASKET STONE RETRIEVAL L120 CM OD12 MM ODSEC1.9 FR ZERO TIP URETERAL 4 (Endoscopic Supplies) IMPLANT
BETADINE SCRUB SURGICAL 4OZ (Scrub Supplies) ×2 IMPLANT
CATHETER BALLOON DILATATION L4 CM L75 CM (Catheter Urine)
CATHETER BLNDIL HDR+ PLMR URMT U 5MM 75 (Catheter Urine)
CATHETER ODSEC5 MM L75 CM ULTRAâ„¢ BALLOON DILATATION L4 CM HYDROPLUS (Catheter Urine) IMPLANT
CATHETER URET 5FR 70CM STRL OP-EN DISP (Catheter Urine) ×1
CATHETER URETERAL OD5 FR L70 CM OPEN END (Catheter Urine) ×1
CATHETER URETERAL OD5 FR L70 CM OPEN END ACCEPTS .038 IN GUIDEWIRE (Catheter Urine) ×1 IMPLANT
GLOVE SURG DERMAPRENE NL SZ8 (Glove) ×2 IMPLANT
GOWN SRG XL SMARTGOWN LF STRL LVL 4 (Gown) ×2
GOWN SURGICAL XL SMARTGOWN LEVEL 4 (Gown) ×2
GOWN SURGICAL XL SMARTGOWN LEVEL 4 BREATHABLE (Gown) ×2 IMPLANT
GUIDEWIRE SENSOR UROL 035X150 (Guide Wire) ×2 IMPLANT
HOLDER CATH VLCR UNV CATH-MATE LF ADJ (Procedure Accessories)
HOLDER CATHETER UNIVERSAL ADJUSTABLE (Procedure Accessories) IMPLANT
SLEEVE CMPR MED KN LGTH KDL SCD 21- IN (Sleeve) ×2
SLEEVE COMPRESSION MEDIUM KNEE LENGTH KENDALL SEQUENTIAL OD21- IN (Sleeve) ×1 IMPLANT
SOLUTION IRR 0.9% NACL 3L ARTHMTC LF (Irrigation Solutions) ×2
SOLUTION IRRIGATION 0.9% SODIUM CHLORIDE (Irrigation Solutions) ×2 IMPLANT
STENT PERCUFLEX HYDROPLUS OD6 FR L26 CM (Stent) ×1 IMPLANT
STENT PERCUFLEX HYDROPLUS OD6 FR L26 CM POLARISâ„¢ 2 DUROMETER TAPER TIP (Stent) ×1 IMPLANT
STENT URET PRCFLX HDR+ 2 PGTL CRV PLRS (Stent) ×1 IMPLANT
SYSTEM IRR 10CC SAPS LF 1 ACT 1 WY VLV (Procedure Accessories) ×1
SYSTEM IRRIGATION 1 ACTION 1 WAY VALVE (Procedure Accessories) ×1
SYSTEM IRRIGATION 1 ACTION 1 WAY VALVE VACUUM SYRINGE SAPS 10 CC (Procedure Accessories) ×1 IMPLANT
TOWEL BLUE STERILE (Procedure Accessories) ×2 IMPLANT
TOWEL L26 IN X W17 IN COTTON PREWASH (Procedure Accessories) ×1
TOWEL L26 IN X W17 IN COTTON PREWASH DELINT BLUE ACTISORB SURGICAL (Procedure Accessories) ×1 IMPLANT
TOWEL SRG CTTN 26X17IN LF STRL PREWASH (Procedure Accessories) ×1
TRAY CATH SILICONE SLOCK 16FR (Tray) IMPLANT
TRAY CYSTOSCOPY PACK (Pack) ×2 IMPLANT
WATER STERILE PLASTIC POUR BOTTLE 1000 (Irrigation Solutions) ×1
WATER STERILE PLASTIC POUR BOTTLE 1000 ML (Irrigation Solutions) ×1 IMPLANT
WATER STRL 1000ML LF PLS PR BTL (Irrigation Solutions) ×1

## 2015-11-29 NOTE — UM Notes (Signed)
11/28/15    [name] [name] 35 y/o male present in the ER c/o fever and headache; dx nephrolithiasis. See below assessment  and care plan.    ER MD HPI-  "[name] [name] is a 35 y.o. male who is presenting with the above chief complaint. He reports that he is having body aches, and feels very badly, his fevers have been very high at home. He also is reporting that he was diagnosed with [facility] tract [facility], he was started on antibiotics but feels nauseous and does not feel any better..."    VS: 102.9, 111, 26, 87/52, 95%  LAB: UA+ leuko esterase, protein, blood, wbc, AST 44, CA++ 8.3, BILI TOTAL 1.8  Sodium 129     140        Potassium 3.8     3.5        Chloride 92     109        CO2 26     26        BUN 11     11        Creatinine 1.1     0.9        Hemoglobin 15.3     13.2        Hematocrit 43.8     38.7        WBC 18.80     13.41        Glucose 107      113                DX:  CT ABD/ PELVIS-  1. Suspect 7 mm distal left renal calculus just proximal to the UVJ with  moderate left hydration process.  2. Right perinephric stranding of uncertain etiology. No right ureteral  calculus.    PERTINENT ER MEDS  TYLENOL 650MG  PO  IV NS BOLUS  IV ZOFRAN 4MG   IV ROCEPHIN 1GM  IV NS BOLUS X2    ADMIT INPATIENT 11/28/15  Admit to Inpatient (Order [id number])   Admission   Order: [id number]    Date: 11/28/2015   Department: [facility]        PLAN  IVF D%+NACL @150ML /HR  INTERVENTIONAL RADIOLOGY CONSULT FOR POSSIBLE PERCUTANEOUS NEPHROSTOMY TUBE

## 2015-11-29 NOTE — Progress Notes (Signed)
Pt arrived on the unit around 7:55pm from surgery. Pt was alert and oriented but drowsy. Family accompanied pt to the floor. Pt put in his room and was oriented to the room and unit. Pt denies being in pain at the time. Urinal provided to the pt for voiding and fall precaution protocol explained to the pt. Vitals obtained and documented. LR @100ML /HR currently infusing. Call bell within reach. Bed in lowest position. Will continue to monitor.

## 2015-11-29 NOTE — Progress Notes (Signed)
MEDICINE PROGRESS NOTE    Date Time: 11/29/2015 4:05 PM  Patient Name: [name] [name],[name] [name]  Attending Physician: Mahlon Gammon, Belva Crome,*      Assessment:     Active Hospital Problems    Diagnosis POA   . Nephrolithiasis Yes      Resolved Hospital Problems    Diagnosis POA   . Principal Problem: Sepsis associated hypotension Yes     Pt is 35 yo male with negative significant PMH was admitted after presenting to ED with sepsis symptoms and back pain. Had been evaluated at [facility] on 7/1. Placed on ? Antibiotic with no improvement of sx.     Initial assessment significant for fever, leukocytosis, tachycardia, and hypotension. Lactic acid normal  Imaging shows L kidney stone with assoc hydronephrosis.      Interim Summary 11/29/2015:  -no recurrent fever.   -improved leukocytosis  -pain controlled.    Plan:   -continue antibiotics.   -blood/urine culture pending.   -Cysto scheduled for later today.     Safety Checklist:     DVT prophylaxis:  CHEST guideline (See page [id number]) mechanical   Foley: Not present   IVs:  Peripheral IV   PT/OT: Not needed       Subjective     CC: Sepsis associated hypotension    Interval History/24 hour events: see above    HPI/Subjective: "I am not happy that I cannot eat"     Review of Systems:   Review of Systems -as per subjective      Physical Exam:     Patient Vitals for the past 24 hrs:   BP Temp Temp src Pulse Resp SpO2 Height Weight   11/29/15 1257 101/63 mmHg 99.5 F (37.5 C) - 71 18 97 % - -   11/29/15 0830 - - - - 18 - - -   11/29/15 0700 102/54 mmHg 98.5 F (36.9 C) Oral 75 - 95 % - -   11/29/15 0520 97/53 mmHg (!) 101.6 F (38.7 C) - 95 19 94 % - -   11/29/15 0401 107/66 mmHg - - 87 18 - - -   11/29/15 0230 (!) 86/46 mmHg 98.6 F (37 C) Oral 68 19 94 % - -   11/29/15 0118 (!) 82/50 mmHg - - - - - - -   11/29/15 0117 (!) 83/47 mmHg 98 F (36.7 C) - 69 20 96 % - -   11/29/15 0039 - - - - 20 - - -   11/29/15 0010 (!) 87/52 mmHg 98 F (36.7 C) - 67 19 93 % - -   11/28/15 2100 -  - - - - - 1.803 m (5\' 11" ) 77.565 kg (171 lb)   11/28/15 2034 92/53 mmHg 98.1 F (36.7 C) - 66 18 95 % - -   11/28/15 2008 101/61 mmHg - - 70 18 97 % - -   11/28/15 1936 (!) 87/52 mmHg - - 73 - - - -   11/28/15 1925 - 98.6 F (37 C) - - - - - -   11/28/15 1830 93/61 mmHg - - 76 (!) 23 95 % - -   11/28/15 1800 94/57 mmHg - - 79 19 95 % - -   11/28/15 1730 98/56 mmHg - - 83 (!) 26 97 % - -     Body mass index is 23.86 kg/(m^2).    Intake/Output Summary (Last 24 hours) at 11/29/15 1605  Last data filed at 11/29/15 0650   Gross per 24  hour   Intake   2226 ml   Output      0 ml   Net   2226 ml       General: awake, alert, comfortable.   Cardiovascular: regular rate and rhythm.  Lungs: clear to auscultation bilaterally.  Abdomen: soft, non-tender.  Extremities: no edema  Other:     Meds:     Current Facility-Administered Medications   Medication Dose Route Frequency   . cefTRIAXone  1 g Intravenous Q12H   . diphenhydrAMINE       . fentaNYL (PF)       . midazolam            Labs:     Results     Procedure Component Value Units Date/Time    GFR [[id number]] Collected:  11/29/15 0653     EGFR >60.0 Updated:  11/29/15 0722    Basic Metabolic Panel [[id number]]  (Abnormal) Collected:  11/29/15 0653    Specimen Information:  Blood Updated:  11/29/15 0722     Glucose 113 (H) mg/dL      BUN 11 mg/dL      Creatinine 0.9 mg/dL      Calcium 7.5 (L) mg/dL      Sodium 621 mEq/L      Potassium 3.5 mEq/L      Chloride 109 mEq/L      CO2 26 mEq/L      Anion Gap 5.0     Lactic acid, plasma - sepsis initial specimen followed by a second specimen if initial result is greater than 2.0 mEq/L [[id number]] Collected:  11/29/15 0653    Specimen Information:  Blood Updated:  11/29/15 9629     Lactic acid 1.4 mmol/L     Narrative:      Cancel second specimen if the initial lactate level is less  than 2.0 mEq/L.    CBC with differential [[id number]]  (Abnormal) Collected:  11/29/15 0653    Specimen Information:  Blood from Blood Updated:  11/29/15  0701     WBC 13.41 (H) x10 3/uL      Hgb 13.2 g/dL      Hematocrit 01.0 (L) %      Platelets 169 x10 3/uL      RBC 3.97 (L) x10 6/uL      MCV 97.5 fL      MCH 33.2 (H) pg      MCHC 34.1 g/dL      RDW 13 %      MPV 8.9 (L) fL      Neutrophils 75.0 %      Lymphocytes Automated 11.1 %      Monocytes 13.6 %      Eosinophils Automated 0.1 %      Basophils Automated 0.2 %      Immature Granulocyte 0.2 %      Nucleated RBC 0.0 /100 WBC      Neutrophils Absolute 10.04 (H) x10 3/uL      Abs Lymph Automated 1.49 x10 3/uL      Abs Mono Automated 1.83 (H) x10 3/uL      Abs Eos Automated 0.02 x10 3/uL      Absolute Baso Automated 0.03 x10 3/uL      Absolute Immature Granulocyte 0.03 x10 3/uL     Lactic acid, plasma - sepsis initial specimen followed by a second specimen if initial result is greater than 2.0 mEq/L [[id number]] Collected:  11/29/15 0115    Specimen Information:  Blood Updated:  11/29/15 0121     Lactic acid 0.9 mmol/L     Narrative:      Cancel second specimen if the initial lactate level is less  than 2.0 mEq/L.    Urine culture [[id number]] Collected:  11/28/15 1630    Specimen Information:  Urine from Urine, Clean Catch Updated:  11/29/15 0034    HIV Ag/Ab 4th generation [[id number]] Collected:  11/28/15 1630     HIV Ag/Ab, 4th Generation Non-Reactive Updated:  11/28/15 2323    Blood Culture Aerobic/Anaerobic #1 [[id number]] Collected:  11/28/15 1630    Specimen Information:  Arm from Blood Updated:  11/28/15 2300    Narrative:      1 BLUE+1 PURPLE    Blood Culture Aerobic/Anaerobic #2 [[id number]] Collected:  11/28/15 1630    Specimen Information:  Arm from Blood Updated:  11/28/15 2300    Narrative:      1 BLUE+1 PURPLE    PT/ APTT [[id number]]  (Abnormal) Collected:  11/28/15 1630     PT 14.9 sec Updated:  11/28/15 2047     PT INR 1.2 (H)      PT Anticoag. Given Within 48 hrs. None      PTT 34 sec     UA with reflex to micro (pts  3 + yrs) [[id number]]  (Abnormal) Collected:  11/28/15 1630    Specimen Information:   Urine Updated:  11/28/15 1715     Urine Type Clean Catch      Color, UA Amber (A)      Clarity, UA Sl Cloudy (A)      Specific Gravity UA 1.012      Urine pH 6.0      Leukocyte Esterase, UA Moderate (A)      Nitrite, UA Negative      Protein, UR 100 (A)      Glucose, UA Negative      Ketones UA Negative      Urobilinogen, UA 2.0 mg/dL      Bilirubin, UA Negative      Blood, UA Small (A)      RBC, UA 3 - 5 /hpf      WBC, UA 26 - 50 (A) /hpf     Comprehensive metabolic panel [[id number]]  (Abnormal) Collected:  11/28/15 1631    Specimen Information:  Blood Updated:  11/28/15 1703     Glucose 107 (H) mg/dL      BUN 11 mg/dL      Creatinine 1.1 mg/dL      Sodium 742 (L) mEq/L      Potassium 3.8 mEq/L      Chloride 92 (L) mEq/L      CO2 26 mEq/L      Calcium 8.3 (L) mg/dL      Protein, Total 6.9 g/dL      Albumin 2.8 (L) g/dL      AST (SGOT) 44 (H) U/L      ALT 31 U/L      Alkaline Phosphatase 76 U/L      Bilirubin, Total 1.8 (H) mg/dL      Globulin 4.1 (H) g/dL      Albumin/Globulin Ratio 0.7 (L)      Anion Gap 11.0     Lipase [[id number]] Collected:  11/28/15 1631    Specimen Information:  Blood Updated:  11/28/15 1703     Lipase 10 U/L     GFR [[id number]] Collected:  11/28/15 1631     EGFR >60.0 Updated:  11/28/15 1703  CBC with differential [[id number]]  (Abnormal) Collected:  11/28/15 1631    Specimen Information:  Blood from Blood Updated:  11/28/15 1651     WBC 18.80 (H) x10 3/uL      Hgb 15.3 g/dL      Hematocrit 40.9 %      Platelets 189 x10 3/uL      RBC 4.51 (L) x10 6/uL      MCV 97.1 fL      MCH 33.9 (H) pg      MCHC 34.9 g/dL      RDW 13 %      MPV 9.1 (L) fL      Neutrophils 81.1 %      Lymphocytes Automated 4.8 %      Monocytes 14.0 %      Eosinophils Automated 0.0 %      Basophils Automated 0.1 %      Immature Granulocyte 0.5 %      Nucleated RBC 0.0 /100 WBC      Neutrophils Absolute 15.25 (H) x10 3/uL      Abs Lymph Automated 0.90 x10 3/uL      Abs Mono Automated 2.63 (H) x10 3/uL      Abs Eos Automated  0.00 x10 3/uL      Absolute Baso Automated 0.02 x10 3/uL      Absolute Immature Granulocyte 0.10 (H) x10 3/uL     Lactic acid x2, sepsis [[id number]] Collected:  11/28/15 1631    Specimen Information:  Blood Updated:  11/28/15 1640     Lactic acid 1.2 mmol/L     Narrative:      Cancel if the initial lactate level is < 2.0 mmol/L.            Imaging personally reviewed, including: Ct Abd/pelvis Without Contrast    11/28/2015  1. Suspect 7 mm distal left renal calculus just proximal to the UVJ with moderate left hydration process. 2. Right perinephric stranding of uncertain etiology. No right ureteral calculus. Lorinda Creed, MD 11/28/2015 5:35 PM           Case discussed with: Dr. Jonette Mate, Pt, RN and CM.     Signed by: Lynnell Jude, ANP

## 2015-11-29 NOTE — Progress Notes (Signed)
Pt BP trending low. Currently 87/52. Scored 1 on sepsis screen. Pt asymptomatic at the moment. MD notified, order received to bolus pt with 1L NS. Will continue to monitor closely.

## 2015-11-29 NOTE — Sedation Documentation (Signed)
Case cancelled for now per Dr. Beverely Low.

## 2015-11-29 NOTE — Progress Notes (Signed)
Pt positive for sepsis screen this AM. Scored 3 on SIRS. Pt remain asymptomatic. Alert and oriented x4. Denies pain or respiratory distress. MD notified and eICU nurse also called. No new orders given. Pt continue on IV antibiotic and maintenance fluid. Will continue to monitor closely and report any changes in pt's status.

## 2015-11-29 NOTE — Progress Notes (Signed)
Pt. Off floor to OR.

## 2015-11-29 NOTE — Consults (Addendum)
ADMISSION HISTORY AND PHYSICAL EXAM    Date Time: 11/29/2015 3:50 PM  Patient Name: [name],[name]  Attending Physician: Mahlon Gammon, Belva Crome,*      Chief Complaint:   Urosepsis, obstructing left ureteral stone    History of Present Illness:   [name] [name] is a 35 y.o. male who presents to the hospital  with acute onset of left flank pain associated with nausea and fever . He also is reporting that he was diagnosed with a urinary tract infection at patient 1st, he was started on antibiotics but feels nauseous and does not feel any better. At ER patient found to be ib sepsis , hypotensive , tach cardiac , and with fever Patient was hydrated and placed on antibiotics and urological consultation was requested . After reviewing CT scan there is possible 7 mm  stone appeared to be impacted  . patient was hypotensive and I  recommended consultation with  CVIR for percutaneous nephrostomy tube . Apparently patient condition improved within 24 hours with IV antibiotics and patient opted for cystoscopy possible stent and  possible laser lithotripsy.  Patient Active Problem List   Diagnosis   . Nephrolithiasis       Past Medical History:     Past Medical History   Diagnosis Date   . Gout        Past Surgical History:   History reviewed. No pertinent past surgical history.    Family History:   History reviewed. No pertinent family history.    Social History:     Social History     Social History   . Marital Status: Single     Spouse Name: N/A   . Number of Children: N/A   . Years of Education: N/A     Social History Main Topics   . Smoking status: Current Every Day Smoker   . Smokeless tobacco: Not on file   . Alcohol Use: Yes      Comment: social   . Drug Use: No   . Sexual Activity: Not on file     Other Topics Concern   . Not on file     Social History Narrative       Allergies:   No Known Allergies    Medications:     Prescriptions prior to admission   Medication Sig   . HYDROcodone-acetaminophen (NORCO) 5-325 MG per  tablet Take 1 tablet by mouth every 6 (six) hours as needed for Pain.     .  Prescriptions prior to admission   Medication Sig Dispense Refill Last Dose   . HYDROcodone-acetaminophen (NORCO) 5-325 MG per tablet Take 1 tablet by mouth every 6 (six) hours as needed for Pain. 20 tablet 0 Unknown at Unknown time       Review of Systems:     Constitutional: negative for chills, fatigue, fevers   Respiratory: negative for cough, dyspnea on exertion and sputum  Cardiovascular: negative for chest pain and palpitations  Gastrointestinal: left flank pain , nausea and vomiting  Genitourinary:negative for dysuria and hematuria  Skin: negative for rash and skin lesion(s)  Hematologic/lymphatic: negative for bleeding and easy bruising  Musculoskeletal:negative for back pain and bone pain  Endocrine: negative for diabetic symptoms including polyuria and pruritus      Physical Exam:     Filed Vitals:    11/29/15 1257   BP: 101/63   Pulse: 71   Temp: 99.5 F (37.5 C)   Resp: 18   SpO2: 97%  Intake and Output Summary (Last 24 hours) at Date Time    Intake/Output Summary (Last 24 hours) at 11/29/15 1550  Last data filed at 11/29/15 0650   Gross per 24 hour   Intake   2226 ml   Output      0 ml   Net   2226 ml       General appearance - alert, well appearing,   Mental status - alert, oriented to person, place, and time  Chest - clear in auscultation,anterior /posterior. No rales or rhonchi  Heart - normal rate, regular rhythm, normal S1, S2, no murmurs appreciated  Abdomen - soft, nontender, nondistended, no masses or organomegaly  GU Male -normal phallis , bilateral descended testicles. No masses or tenderness;   Left CVA tenderness  Neurological - alert, oriented,no evidence of neurological  Deficits.  Musculoskeletal - no joint tenderness, deformity or swelling  Extremities - peripheral pulses normal, no pedal edema,no venous stasis  Skin - normal coloration and turgor, worm  And no rashes  Labs:     Results     Procedure  Component Value Units Date/Time    GFR [[id number]] Collected:  11/29/15 0653     EGFR >60.0 Updated:  11/29/15 0722    Basic Metabolic Panel [[id number]]  (Abnormal) Collected:  11/29/15 0653    Specimen Information:  Blood Updated:  11/29/15 0722     Glucose 113 (H) mg/dL      BUN 11 mg/dL      Creatinine 0.9 mg/dL      Calcium 7.5 (L) mg/dL      Sodium 865 mEq/L      Potassium 3.5 mEq/L      Chloride 109 mEq/L      CO2 26 mEq/L      Anion Gap 5.0     Lactic acid, plasma - sepsis initial specimen followed by a second specimen if initial result is greater than 2.0 mEq/L [[id number]] Collected:  11/29/15 0653    Specimen Information:  Blood Updated:  11/29/15 2841     Lactic acid 1.4 mmol/L     Narrative:      Cancel second specimen if the initial lactate level is less  than 2.0 mEq/L.    CBC with differential [[id number]]  (Abnormal) Collected:  11/29/15 0653    Specimen Information:  Blood from Blood Updated:  11/29/15 0701     WBC 13.41 (H) x10 3/uL      Hgb 13.2 g/dL      Hematocrit 25.3 (L) %      Platelets 169 x10 3/uL      RBC 3.97 (L) x10 6/uL      MCV 97.5 fL      MCH 33.2 (H) pg      MCHC 34.1 g/dL      RDW 13 %      MPV 8.9 (L) fL      Neutrophils 75.0 %      Lymphocytes Automated 11.1 %      Monocytes 13.6 %      Eosinophils Automated 0.1 %      Basophils Automated 0.2 %      Immature Granulocyte 0.2 %      Nucleated RBC 0.0 /100 WBC      Neutrophils Absolute 10.04 (H) x10 3/uL      Abs Lymph Automated 1.49 x10 3/uL      Abs Mono Automated 1.83 (H) x10 3/uL      Abs Eos Automated 0.02 x10  3/uL      Absolute Baso Automated 0.03 x10 3/uL      Absolute Immature Granulocyte 0.03 x10 3/uL     Lactic acid, plasma - sepsis initial specimen followed by a second specimen if initial result is greater than 2.0 mEq/L [[id number]] Collected:  11/29/15 0115    Specimen Information:  Blood Updated:  11/29/15 0121     Lactic acid 0.9 mmol/L     Narrative:      Cancel second specimen if the initial lactate level is less  than  2.0 mEq/L.    Urine culture [[id number]] Collected:  11/28/15 1630    Specimen Information:  Urine from Urine, Clean Catch Updated:  11/29/15 0034    HIV Ag/Ab 4th generation [[id number]] Collected:  11/28/15 1630     HIV Ag/Ab, 4th Generation Non-Reactive Updated:  11/28/15 2323    Blood Culture Aerobic/Anaerobic #1 [[id number]] Collected:  11/28/15 1630    Specimen Information:  Arm from Blood Updated:  11/28/15 2300    Narrative:      1 BLUE+1 PURPLE    Blood Culture Aerobic/Anaerobic #2 [[id number]] Collected:  11/28/15 1630    Specimen Information:  Arm from Blood Updated:  11/28/15 2300    Narrative:      1 BLUE+1 PURPLE    PT/ APTT [[id number]]  (Abnormal) Collected:  11/28/15 1630     PT 14.9 sec Updated:  11/28/15 2047     PT INR 1.2 (H)      PT Anticoag. Given Within 48 hrs. None      PTT 34 sec     UA with reflex to micro (pts  3 + yrs) [[id number]]  (Abnormal) Collected:  11/28/15 1630    Specimen Information:  Urine Updated:  11/28/15 1715     Urine Type Clean Catch      Color, UA Amber (A)      Clarity, UA Sl Cloudy (A)      Specific Gravity UA 1.012      Urine pH 6.0      Leukocyte Esterase, UA Moderate (A)      Nitrite, UA Negative      Protein, UR 100 (A)      Glucose, UA Negative      Ketones UA Negative      Urobilinogen, UA 2.0 mg/dL      Bilirubin, UA Negative      Blood, UA Small (A)      RBC, UA 3 - 5 /hpf      WBC, UA 26 - 50 (A) /hpf     Comprehensive metabolic panel [[id number]]  (Abnormal) Collected:  11/28/15 1631    Specimen Information:  Blood Updated:  11/28/15 1703     Glucose 107 (H) mg/dL      BUN 11 mg/dL      Creatinine 1.1 mg/dL      Sodium 756 (L) mEq/L      Potassium 3.8 mEq/L      Chloride 92 (L) mEq/L      CO2 26 mEq/L      Calcium 8.3 (L) mg/dL      Protein, Total 6.9 g/dL      Albumin 2.8 (L) g/dL      AST (SGOT) 44 (H) U/L      ALT 31 U/L      Alkaline Phosphatase 76 U/L      Bilirubin, Total 1.8 (H) mg/dL      Globulin 4.1 (H) g/dL  Albumin/Globulin Ratio 0.7 (L)      Anion  Gap 11.0     Lipase [[id number]] Collected:  11/28/15 1631    Specimen Information:  Blood Updated:  11/28/15 1703     Lipase 10 U/L     GFR [[id number]] Collected:  11/28/15 1631     EGFR >60.0 Updated:  11/28/15 1703    CBC with differential [[id number]]  (Abnormal) Collected:  11/28/15 1631    Specimen Information:  Blood from Blood Updated:  11/28/15 1651     WBC 18.80 (H) x10 3/uL      Hgb 15.3 g/dL      Hematocrit 24.4 %      Platelets 189 x10 3/uL      RBC 4.51 (L) x10 6/uL      MCV 97.1 fL      MCH 33.9 (H) pg      MCHC 34.9 g/dL      RDW 13 %      MPV 9.1 (L) fL      Neutrophils 81.1 %      Lymphocytes Automated 4.8 %      Monocytes 14.0 %      Eosinophils Automated 0.0 %      Basophils Automated 0.1 %      Immature Granulocyte 0.5 %      Nucleated RBC 0.0 /100 WBC      Neutrophils Absolute 15.25 (H) x10 3/uL      Abs Lymph Automated 0.90 x10 3/uL      Abs Mono Automated 2.63 (H) x10 3/uL      Abs Eos Automated 0.00 x10 3/uL      Absolute Baso Automated 0.02 x10 3/uL      Absolute Immature Granulocyte 0.10 (H) x10 3/uL     Lactic acid x2, sepsis [[id number]] Collected:  11/28/15 1631    Specimen Information:  Blood Updated:  11/28/15 1640     Lactic acid 1.2 mmol/L     Narrative:      Cancel if the initial lactate level is < 2.0 mmol/L.          Imaging/Radiology:   Ct Abd/pelvis Without Contrast    11/28/2015  CT abdomen and pelvis without contrast    Indication:    Bilateral flank pain Comparison:   None available. . Technique:   Axial CT images were obtained through the abdomen and pelvis without contrast.A combination of automatic exposure control, adjustment of the mA and/or kV  according to patient size and/or use of iterative reconstruction technique was utilized. Findings: Lung bases: Lung bases are clear. Hepatobiliary: Liver is normal in size and contour.  Pancreas within normal limits. Spleen: Within normal limits Adrenal glands: No adrenal nodule Kidneys: 7 mm calcification which is in the expected  location of the distal ureter with moderate left hydroureteronephrosis. This likely an obstructing ureteral calculus although the ureter is not well seen at this level. There is mild right perinephric stranding of uncertain etiology. No right nephrolithiasis or right ureteral calculus. Bowel and mesentery: No bowel obstruction or bowel wall thickening.   Normal appendix. No bulky mesenteric lymphadenopathy Aorta: Aorta is normal in caliber.  No retroperitoneal lymphadenopathy Pelvis: Bladder empty..  Prostate not enlarged.  Bones: Bones within normal limits.     11/28/2015  1. Suspect 7 mm distal left renal calculus just proximal to the UVJ with moderate left hydration process. 2. Right perinephric stranding of uncertain etiology. No right ureteral calculus. Lorinda Creed, MD 11/28/2015 5:35 PM  Assessment:   Hydronephrosis . Left  Sepsis   Renal colic        Plan:   Discussed with patient and mother  cystoscopy , possible laser lithotripsy , possible stent placement       Signed by: Prescilla Sours, MD

## 2015-11-29 NOTE — Transfer of Care (Signed)
Anesthesia Transfer of Care Note    Patient: [name] [name]    Procedures performed: Procedure(s) with comments:  CYSTOSCOPY, URETEROSCOPY, RETROGRADE PYELOGRAM  AND INSERTION OF LEFT DOUBLE  J STENT .  - **IP-332-1/MD AVAIL 1600**    Anesthesia type: General ETT    Patient location:Phase I PACU    Last vitals:   Filed Vitals:    11/29/15 1657   BP: 111/68   Pulse: 76   Temp: 37.3 C (99.2 F)   Resp: 18   SpO2: 95%       Post pain: Patient not complaining of pain, continue current therapy      Mental Status:awake and alert     Respiratory Function: tolerating nasal cannula    Cardiovascular: stable    Nausea/Vomiting: patient not complaining of nausea or vomiting    Hydration Status: adequate    Post assessment: no apparent anesthetic complications and no reportable events    Signed by: Lynann Bologna  11/29/2015 7:16 PM

## 2015-11-29 NOTE — Plan of Care (Signed)
Problem: Safety  Goal: Patient will be free from injury during hospitalization  Outcome: Progressing    11/29/15 1147   Goal/Interventions addressed this shift   Patient will be free from injury during hospitalization  Assess patient's risk for falls and implement fall prevention plan of care per policy;Provide and maintain safe environment;Use appropriate transfer methods;Ensure appropriate safety devices are available at the bedside;Hourly rounding;Provide alternative method of communication if needed [employer], writing);Include patient/ family/ care giver in decisions related to safety         Problem: Infection  Goal: Free from infection  Outcome: Progressing    11/29/15 1147   OTHER   Free from infection  Assess for signs/symptoms of infection;Utilize isolation precautions per protocol/policy;Utilize sepsis protocol

## 2015-11-29 NOTE — Progress Notes (Signed)
[facility] ([facility])    Received a referral to schedule a follow up appointment with the [facility]  Appointment scheduled for Tuesday 12/10/15 at 11AM at [place] location.      Clinic address is as follows:    [address]. [place], [address]      Please notify patient to arrive 15 minutes early to the appointment and bring the following materials with them:    Insurance card (if insured) and photo ID  Medications in their original bottles  Glucometer/blood sugar log (if diabetic)  Weight log (if heart failure)  Proof of income (to enroll in medication assistance programs-first two pages of signed 1040 tax forms or last 2 months of pay stubs)      Terressa Koyanagi  Transitional Services   Sched Reg Rep II  T [phone]  F [phone]

## 2015-11-29 NOTE — Consults (Signed)
Interventional Radiology  Consultation Note      Assessment:     35 yo with urosepsis and left UVJ stone no significant hydronephrosis or hydroureter. Urology has seen the patient and will take for cystoscopy today. No need for IR intervention at this time    Plan:     -Cystoscopy with urology today.    Objective:     No Known Allergies  Prior to Admission medications    Medication Sig Start Date End Date Taking? Authorizing Provider   HYDROcodone-acetaminophen (NORCO) 5-325 MG per tablet Take 1 tablet by mouth every 6 (six) hours as needed for Pain. 12/23/14   Erick Blinks, PA        Current facility-administered medications:   .  acetaminophen (TYLENOL) tablet 650 mg, 650 mg, Oral, Q6H PRN, Rhae Hammock, MD, 650 mg at 11/29/15 0528  .  cefTRIAXone (ROCEPHIN) 1 g in sodium chloride 0.9 % 100 mL IVPB mini-bag plus, 1 g, Intravenous, Q12H, Morris, Herschell Dimes, MD, Last Rate: 200 mL/hr at 11/29/15 0528, 1 g at 11/29/15 0528  .  dextrose  5 % and 0.9 % NaCl infusion, , Intravenous, Continuous, Morris, Herschell Dimes, MD, Last Rate: 150 mL/hr at 11/29/15 0721, 150 mL/hr at 11/29/15 0721  .  diphenhydrAMINE (BENADRYL) 50 MG/ML injection, , , ,   .  fentaNYL (PF) (SUBLIMAZE) 0.05 MG/ML injection, , , ,   .  midazolam (VERSED) 2 MG/2ML injection, , , ,   .  morphine injection 2 mg, 2 mg, Intravenous, Q3H PRN, Rhae Hammock, MD, 2 mg at 11/29/15 0405  .  naloxone [facility]) injection 0.2 mg, 0.2 mg, Intravenous, PRN, Morris, Herschell Dimes, MD  .  ondansetron (ZOFRAN-ODT) disintegrating tablet 4 mg, 4 mg, Oral, Q6H PRN **OR** ondansetron (ZOFRAN) injection 4 mg, 4 mg, Intravenous, Q6H PRN, Morris, Herschell Dimes, MD    Past Medical History   Diagnosis Date   . Gout      History reviewed. No pertinent past surgical history.    History reviewed. No pertinent family history.  Social History   Substance Use Topics   . Smoking status: Current Every Day Smoker   . Smokeless tobacco: None   . Alcohol Use: Yes      Comment: social        Filed Vitals:    11/29/15 0520 11/29/15 0700 11/29/15 0830 11/29/15 1257   BP: 97/53 102/54  101/63   Pulse: 95 75  71   Temp: 101.6 F (38.7 C) 98.5 F (36.9 C)  99.5 F (37.5 C)   TempSrc:  Oral     Resp: 19  18 18    Height:       Weight:       SpO2: 94% 95%  97%     Recent Labs      11/29/15   0653   WBC  13.41*   HEMATOCRIT  38.7*   MCV  97.5     Recent Labs      11/29/15   0653   11/28/15   1630   SODIUM  140   < >   --    CO2  26   < >   --    ANION GAP  5.0   < >   --    BUN  11   < >   --    CREATININE  0.9   < >   --    PT INR   --    --  1.2*    < > = values in this interval not displayed.     Recent Labs      11/28/15   1631   ALKALINE PHOSPHATASE  76     Invalid input(s): [place], UGLU, UBILIRUBIN, UKET, USPG, UOCB, UPH, UPSC, Mittie Bodo, Alda Berthold  Lab Results   Component Value Date    PT 14.9 11/28/2015    INR 1.2* 11/28/2015     No results found for: APTT

## 2015-11-29 NOTE — ED Provider Notes (Signed)
EMERGENCY DEPARTMENT HISTORY AND PHYSICAL EXAM    Date Time: 11/29/2015 10:30 AM  Patient Name: [name] [name],[name] [name], 35 y.o., male  ED Provider: Sonda Primes, MD    History of Presenting Illness:     Chief Complaint: fevers  History obtained from: Patient.  Onset/Duration: 4 day duration  Quality: bilateral flank pain  Severity: moderate  Aggravating Factors: none  Alleviating Factors: none  Associated Symptoms: none  Narrative/Additional Historical Findings:[name] [name] is a 35 y.o. male  who is presenting with the above chief complaint.  He reports that he is having body aches, and feels very badly, his fevers have been very high at home.  He also is reporting that he was diagnosed with [facility], he was started on antibiotics but feels nauseous and does not feel any better.  He reports decreased appetite.    Nursing notes from this date of service were reviewed.    Past Medical History:     Past Medical History   Diagnosis Date   . Gout        Past Surgical History:   History reviewed. No pertinent past surgical history.    Family History:   History reviewed. No pertinent family history.    Social History:     Social History     Social History   . Marital Status: Single     Spouse Name: N/A   . Number of Children: N/A   . Years of Education: N/A     Social History Main Topics   . Smoking status: Current Every Day Smoker   . Smokeless tobacco: Not on file   . Alcohol Use: Yes      Comment: social   . Drug Use: No   . Sexual Activity: Not on file     Other Topics Concern   . Not on file     Social History Narrative       Allergies:   No Known Allergies    Medications:     Current facility-administered medications:   .  acetaminophen (TYLENOL) tablet 650 mg, 650 mg, Oral, Q6H PRN, Rhae Hammock, MD, 650 mg at 11/29/15 0528  .  cefTRIAXone (ROCEPHIN) 1 g in sodium chloride 0.9 % 100 mL IVPB mini-bag plus, 1 g, Intravenous, Q12H, Morris, Herschell Dimes, MD, Last Rate: 200 mL/hr at 11/29/15 0528,  1 g at 11/29/15 0528  .  dextrose  5 % and 0.9 % NaCl infusion, , Intravenous, Continuous, Morris, Herschell Dimes, MD, Last Rate: 150 mL/hr at 11/29/15 0721, 150 mL/hr at 11/29/15 0721  .  diphenhydrAMINE (BENADRYL) 50 MG/ML injection, , , ,   .  fentaNYL (PF) (SUBLIMAZE) 0.05 MG/ML injection, , , ,   .  midazolam (VERSED) 2 MG/2ML injection, , , ,   .  morphine injection 2 mg, 2 mg, Intravenous, Q3H PRN, Rhae Hammock, MD, 2 mg at 11/29/15 0405  .  naloxone [facility]) injection 0.2 mg, 0.2 mg, Intravenous, PRN, Morris, Herschell Dimes, MD  .  ondansetron (ZOFRAN-ODT) disintegrating tablet 4 mg, 4 mg, Oral, Q6H PRN **OR** ondansetron (ZOFRAN) injection 4 mg, 4 mg, Intravenous, Q6H PRN, Morris, Herschell Dimes, MD    Review of Systems:   Constitutional: No fever or change in activity.  Eyes: No eye redness. No eye discharge.  ENT: No ear pain or sore throat  Cardiovascular: No cp or palpitations  Respiratory: No cough or shortness of breath.  GI: No vomiting or diarrhea.  Genitourinary: Normal  urination frequency  Musculoskeletal: No extremity pain or decreased use  Skin: no rash or skin lesions.  Neurologic: Normal level of alertness    All other systems reviewed and are negative    Physical Exam:   ED Triage Vitals   Enc Vitals Group      BP 11/28/15 1600 97/63 mmHg      Heart Rate 11/28/15 1600 111      Resp Rate 11/28/15 1600 17      Temp 11/28/15 1600 102.9 F (39.4 C)      Temp Source 11/28/15 1600 Oral      SpO2 11/28/15 1600 96 %      Weight 11/28/15 1600 77.61 kg      Height 11/28/15 2100 1.803 m      Head Cir --       Peak Flow --       Pain Score 11/28/15 1600 10      Pain Loc --       Pain Edu? --       Excl. in GC? --      Constitutional: Vital signs reviewed.  Dry mucous membranes, and no increased work of breathing. Appearance: Toxic appearance  Head:  Normocephalic, atraumatic  Eyes: No conjunctival injection. No discharge. EOMI  ENT: Mucous membranes dry, No oral lesions.  Neck: Normal range of motion.  Non-tender.  Respiratory/Chest: Clear to auscultation. No respiratory distress.   Cardiovascular: Tachycardic. No murmur.   Abdomen: Soft and non-tender. No masses or hepatosplenomegaly.  Genitourinary:  UpperExtremity: No edema or cyanosis.  Moving well.  LowerExtremity: No edema or cyanosis.  Moving well.  Neurological: No focal motor deficits by observation. Speech normal. Memory normal.  Skin: Warm and dry. No rash.  Lymphatic: No cervical lymphadenopathy.  Psychiatric: Normal affect. Normal concentration.    Labs:     Labs Reviewed   CBC AND DIFFERENTIAL - Abnormal; Notable for the following:     WBC 18.80 (*)     RBC 4.51 (*)     MCH 33.9 (*)     MPV 9.1 (*)     Neutrophils Absolute 15.25 (*)     Abs Mono Automated 2.63 (*)     Absolute Immature Granulocyte 0.10 (*)     All other components within normal limits   COMPREHENSIVE METABOLIC PANEL - Abnormal; Notable for the following:     Glucose 107 (*)     Sodium 129 (*)     Chloride 92 (*)     Calcium 8.3 (*)     Albumin 2.8 (*)     AST (SGOT) 44 (*)     Bilirubin, Total 1.8 (*)     Globulin 4.1 (*)     Albumin/Globulin Ratio 0.7 (*)     All other components within normal limits   URINALYSIS, REFLEX TO MICROSCOPIC EXAM IF INDICATED - Abnormal; Notable for the following:     Color, UA Amber (*)     Clarity, UA Sl Cloudy (*)     Leukocyte Esterase, UA Moderate (*)     Protein, UR 100 (*)     Blood, UA Small (*)     WBC, UA 26 - 50 (*)     All other components within normal limits   PT AND APTT - Abnormal; Notable for the following:     PT INR 1.2 (*)     All other components within normal limits   BASIC METABOLIC PANEL - Abnormal; Notable for the following:  Glucose 113 (*)     Calcium 7.5 (*)     All other components within normal limits   CBC AND DIFFERENTIAL - Abnormal; Notable for the following:     WBC 13.41 (*)     Hematocrit 38.7 (*)     RBC 3.97 (*)     MCH 33.2 (*)     MPV 8.9 (*)     Neutrophils Absolute 10.04 (*)     Abs Mono Automated 1.83 (*)      All other components within normal limits   URINE CULTURE   CULTURE BLOOD AEROBIC AND ANAEROBIC    Narrative:     1 BLUE+1 PURPLE   CULTURE BLOOD AEROBIC AND ANAEROBIC    Narrative:     1 BLUE+1 PURPLE   LACTIC ACID, PLASMA    Narrative:     Cancel if the initial lactate level is < 2.0 mmol/L.   LIPASE   GFR   HIV AG/AB 4TH GENERATION   GFR   LACTIC ACID, PLASMA    Narrative:     Cancel second specimen if the initial lactate level is less  than 2.0 mEq/L.   LACTIC ACID, PLASMA    Narrative:     Cancel second specimen if the initial lactate level is less  than 2.0 mEq/L.         Rads:     Radiology Results (24 Hour)     Procedure Component Value Units Date/Time    Perc Neph Tube Placement [[id number]] Resulted:  11/29/15 0840    Order Status:  Sent Updated:  11/29/15 0840    CT Abd/Pelvis without Contrast [[id number]] Collected:  11/28/15 1732    Order Status:  Completed Updated:  11/28/15 1739    Narrative:      CT abdomen and pelvis without contrast       Indication:    Bilateral flank pain    Comparison:   None available. .    Technique:   Axial CT images were obtained through the abdomen and  pelvis without contrast.A combination of automatic exposure control,  adjustment of the mA and/or kV    according to patient size and/or use of iterative reconstruction  technique was   utilized.    Findings:  Lung bases: Lung bases are clear.    Hepatobiliary: Liver is normal in size and contour.  Pancreas within  normal limits.    Spleen: Within normal limits    Adrenal glands: No adrenal nodule    Kidneys: 7 mm calcification which is in the expected location of the  distal ureter with moderate left hydroureteronephrosis. This likely an  obstructing ureteral calculus although the ureter is not well seen at  this level. There is mild right perinephric stranding of uncertain  etiology. No right nephrolithiasis or right ureteral calculus.    Bowel and mesentery: No bowel obstruction or bowel wall thickening.     Normal  appendix.  No bulky mesenteric lymphadenopathy    Aorta: Aorta is normal in caliber.    No retroperitoneal lymphadenopathy      Pelvis: Bladder empty..  Prostate not enlarged.      Bones: Bones within normal limits.        Impression:        1. Suspect 7 mm distal left renal calculus just proximal to the UVJ with  moderate left hydration process.  2. Right perinephric stranding of uncertain etiology. No right ureteral  calculus.    Lorinda Creed, MD  11/28/2015 5:35 PM            MDM and ED Course   DR. Zacchaeus Halm  is the primary attending for this patient and has obtained and performed the history, PE, and medical decision making for this patient.    MDM:    Finding was discussed with Dr. Karel Jarvis from urology, he was also notified that the patient met sepsis criteria as well, he felt that the stone was too large, and not amenable for stenting at this time.    The case was then discussed with Dr. Excell Seltzer from interventional radiology for placement of percutaneous nephrostomy tube, this was also discussed with Dr. Langston Masker.  They will reassess the patient, to see if he continues to have decline.  At this point in the emergency department.  He has received his 30 mL per KG fluid resuscitation with approximately 2.3 L of normal saline.  He is also receive IV antibiotics.    Initial tachycardia and fever have resolved with antipyretics and fluid resuscitation.  Patient still remains slightly hypotensive, he does not know his baseline blood pressure.  But denies any symptoms at this time.  Reports that his pain and his symptoms are much improved.    A urinalysis also demonstrated infection as well, suspect that the patient has bilateral pyelonephritis as well.    Assessment/Plan:   Results and instructions reviewed at the bedside with patient and family.    Clinical Impression  Final diagnoses:   Nephrolithiasis   sepsis      Disposition  ED Disposition     Admit Admitting Physician: Arna Medici [[id number]]  Diagnosis:  Nephrolithiasis [604540]  Estimated Length of Stay: > or = to 2 midnights  Tentative Discharge Plan?: Home or Self Care [1]  Patient Class: Inpatient [101]            Prescriptions  Current Discharge Medication List              Signed by: Vito Backers, MD  11/29/15 1035

## 2015-11-29 NOTE — Progress Notes (Signed)
Pt's BP 86/46 post 1L bolus. Pt still asymptomatic. MD made aware. No new orders given. Pt remain on maintenance fluids. Repeat lactic acid done. Will continue to monitor and treat.

## 2015-11-29 NOTE — Anesthesia Preprocedure Evaluation (Signed)
Anesthesia Evaluation    AIRWAY    Mallampati: II    TM distance: >3 FB  Neck ROM: full  Mouth Opening:full   CARDIOVASCULAR    cardiovascular exam normal       DENTAL         PULMONARY    pulmonary exam normal     OTHER FINDINGS    Urosepsis, obstructing left ureteral stone                  Anesthesia Plan    ASA 2 - emergent     general               (No nausea or vomiting  Pt presented yesterday with urosepsis, denies any chills/fever)      intravenous induction   Detailed anesthesia plan: general endotracheal        Post op pain management: per surgeon    informed consent obtained    Plan discussed with CRNA.      pertinent labs reviewed

## 2015-11-29 NOTE — Anesthesia Postprocedure Evaluation (Signed)
Anesthesia Post Evaluation    Patient: [name] [name]    Procedures performed: Procedure(s) with comments:  CYSTOSCOPY, URETEROSCOPY, RETROGRADE PYELOGRAM  AND INSERTION OF LEFT DOUBLE  J STENT .  - **[id number]/MD AVAIL 1600**    Anesthesia type: General ETT    Patient location:Phase I PACU    Last vitals:   Filed Vitals:    11/29/15 1935   BP: 113/72   Pulse: 81   Temp: 37.2 C (99 F)   Resp: 17   SpO2: 96%       Post pain: Patient not complaining of pain, continue current therapy      Mental Status:awake and alert     Respiratory Function: tolerating nasal cannula    Cardiovascular: stable    Nausea/Vomiting: patient not complaining of nausea or vomiting    Hydration Status: adequate    Post assessment: no apparent anesthetic complications and no reportable events.  O2 saturation was between 90-95% on 3l/min NC.  Pt was coughing in the PACU.  Pt also had a lot of secretions from the ETT intraop which I suctioned from the ETT prior to extubation.  Pt's O2 saturation prior to the OR was 95% on RA.  No sob or respiratory complaints.    Lynann Bologna, 11/29/2015 7:39 PM

## 2015-11-29 NOTE — Plan of Care (Signed)
Problem: Safety  Goal: Patient will be free from injury during hospitalization  Outcome: Progressing    11/29/15 0214   Goal/Interventions addressed this shift   Patient will be free from injury during hospitalization  Assess patient's risk for falls and implement fall prevention plan of care per policy;Provide and maintain safe environment;Ensure appropriate safety devices are available at the bedside;Use appropriate transfer methods     Pt remain alert and oriented x4. Ambulates independently with contact guard. Safety measures and fall prevention in place. Room is well lit, free from spills and clutter. Bed locked and in lowest position with 2 side rails up. Call bell and belongings are within reach. Patient is aware of safety measures.    Problem: Pain  Goal: Pain at adequate level as identified by patient  Outcome: Progressing    11/29/15 0214   Goal/Interventions addressed this shift   Pain at adequate level as identified by patient Identify patient comfort function goal;Assess pain on admission, during daily assessment and/or before any "as needed" intervention(s);Reassess pain within 30-60 minutes of any procedure/intervention, per Pain Assessment, Intervention, Reassessment (AIR) Cycle;Evaluate if patient comfort function goal is met;Evaluate patient's satisfaction with pain management progress;Offer non-pharmacological pain management interventions     Pt on morphine PRN. Pain assessment done frequent. No complains of pain at the moment. Will continue to monitor and treat.    Problem: Bladder/Voiding  Goal: Free from infection  Outcome: Progressing    11/29/15 0214   Goal/Interventions addressed this shift   Free from infection Monitor/assess for signs and symptoms of infection     Pt appears afebrile on shift. Sepsis screen done on shift. Pt continue on IV antibiotic and IV fluids as per order. No chills or fever noted on shift. Pt stating well on RA. Will continue to monitor. PRN morphine administered as  per order. Purposeful rounding implemented. Will continue to monitor.   Goal: Patient will experience proper bladder emptying during admission  Outcome: Progressing    11/29/15 0214   Goal/Interventions addressed this shift   Patient will experience proper bladder emptying during admission Encourage patient to empty bladder at regular intervals         Comments:   Pt admitted to unit from ED around 2030. Alert and oriented x4. Able to make needs known. Pt denies any pain or respiratory distress at the moment. Lung sounds clear bilaterally. Pt sating well on RA. Patient continent X 2 and ambulatory to bathroom with one person assist. Oriented to unit and use of call bell. Patient educated about safety measures, understanding verbalized. Learning abilities of the pt accessed and plan of care discussed with pt.  Med education and side effects reinforced. Pt verbalized understanding. All questions and concerns answered. Will continue to educated pt. Hourly rounding in progress.

## 2015-11-29 NOTE — Plan of Care (Signed)
Problem: Safety  Goal: Patient will be free from injury during hospitalization  Outcome: Progressing    Problem: Knowledge Deficit, Education, Discharge Plan  Goal: Verbalizes understanding of medication, benefits, and side effects  Outcome: Progressing    11/29/15 1139   Goal/Interventions addressed this shift   Verbalizes understanding of medication, benefits, and side effects Ensure Informed consent for all psychotropic medications;Provide medication teaching including name, dosage, benefits, action, effect and side effects;Encourage to report response to medications including side effects         Problem: Infection  Goal: Free from infection  Outcome: Progressing    Comments:   Pt is NPO.   IVF infusing.

## 2015-11-29 NOTE — Progress Notes (Signed)
Due to the nature of planned procedures, patient has been deemed a high fall risk.  The following interventions have been in place in the preop department:    -Yellow armband applied  -Yellow tread socks applies  -Keep curtain open for better visibility of patient  -Walk with patient to the restroom, and stay with patient if appropriate

## 2015-11-29 NOTE — Brief Op Note (Signed)
BRIEF OP NOTE    Date Time: 11/29/2015 7:13 PM    Patient Name:   [name] [name]    Date of Operation:   11/29/2015    Providers Performing:   Surgeon(s):  Prescilla Sours, MD    Assistant (s):    Operative Procedure:   Procedure(s):  CYSTOSCOPY, URETEROSCOPY, RETROGRADE PYELOGRAM  AND INSERTION OF LEFT DOUBLE  J STENT .     Preoperative Diagnosis:   Pre-Op Diagnosis Codes:     * Ureteral obstruction [N13.5]   Hydronephrosis  Acute pyelonephritis    Postoperative Diagnosis:   Same as above    Anesthesia:   General    Estimated Blood Loss:   Minimal    Drains:       Specimens:       Findings:   pyelonephritis , no renal or ureteral stone    Complications:   None      Signed by: Prescilla Sours, MD                                                                           [facility]

## 2015-11-30 DIAGNOSIS — N12 Tubulo-interstitial nephritis, not specified as acute or chronic: Secondary | ICD-10-CM

## 2015-11-30 DIAGNOSIS — N131 Hydronephrosis with ureteral stricture, not elsewhere classified: Secondary | ICD-10-CM

## 2015-11-30 DIAGNOSIS — A419 Sepsis, unspecified organism: Principal | ICD-10-CM

## 2015-11-30 LAB — BASIC METABOLIC PANEL
Anion Gap: 6 (ref 5.0–15.0)
BUN: 11 mg/dL (ref 9–28)
CO2: 26 mEq/L (ref 22–29)
Calcium: 8.2 mg/dL — ABNORMAL LOW (ref 8.5–10.5)
Chloride: 107 mEq/L (ref 100–111)
Creatinine: 0.8 mg/dL (ref 0.7–1.3)
Glucose: 121 mg/dL — ABNORMAL HIGH (ref 70–100)
Potassium: 3.8 mEq/L (ref 3.5–5.1)
Sodium: 139 mEq/L (ref 136–145)

## 2015-11-30 LAB — CBC
Hematocrit: 40.3 % — ABNORMAL LOW (ref 42.0–52.0)
Hgb: 13.4 g/dL (ref 13.0–17.0)
MCH: 33.3 pg — ABNORMAL HIGH (ref 28.0–32.0)
MCHC: 33.3 g/dL (ref 32.0–36.0)
MCV: 100.2 fL — ABNORMAL HIGH (ref 80.0–100.0)
MPV: 9.9 fL (ref 9.4–12.3)
Nucleated RBC: 0 /100 WBC (ref 0.0–1.0)
Platelets: 203 10*3/uL (ref 140–400)
RBC: 4.02 10*6/uL — ABNORMAL LOW (ref 4.70–6.00)
RDW: 14 % (ref 12–15)
WBC: 12.47 10*3/uL — ABNORMAL HIGH (ref 3.50–10.80)

## 2015-11-30 LAB — GFR: EGFR: >60

## 2015-11-30 MED ORDER — OXYCODONE-ACETAMINOPHEN 5-325 MG PO TABS
1.0000 | ORAL_TABLET | ORAL | Status: DC | PRN
Start: 2015-11-30 — End: 2015-12-01

## 2015-11-30 MED ORDER — MORPHINE SULFATE 4 MG/ML IJ/IV SOLN (WRAP)
4.0000 mg | Status: DC | PRN
Start: 2015-11-30 — End: 2015-11-30

## 2015-11-30 NOTE — Plan of Care (Signed)
Problem: Bladder/Voiding  Goal: Free from infection  Outcome: Progressing

## 2015-11-30 NOTE — Progress Notes (Signed)
INTERNAL MEDICINE PROGRESS NOTE  [facility], Division of Hospitalist Medicine  [facility]  [place] pager: [phone]      Date Time: 11/30/2015 1:53 PM  Patient Name: [name] [name],[name] [name]  Attending Physician: Theresia Lo, MD    Assessment:   Principal Problem (Resolved):    Sepsis associated hypotension  Active Problems:    Nephrolithiasis      35 yo male with negative significant PMH was admitted after presenting to ED with sepsis symptoms and back pain. Had been evaluated at [facility] on 7/1. Placed on ? Antibiotic with no improvement of sx. He had CT which shows right perinephritic stranding and left side hydronephrosis ??>stone - urology was consulted and he went for cyctoscopy - no stone was seen , stent was placed 7/7     Plan:   Sepsis Present on admission    Acute pyelonephritis   Left hydronephrosis   Sp cystoscopy and stent placement   - C/W IV abx   - Urology fu   - monitor BUN cr   - anticipate [place] in next 24 h when clear by urology     Case discussed with RN  Subjective/24 hour events:   Pt dose not have any pain , no nausea or vomiting ,No fever - WBC comes down to 12.4 , BUN cr stable ., sp stent 7/7   Blood cx NGTD     Safety checklist:   DVT prophylaxis:  Foley catheter:None   IV access: Peripheral   PT/OT:  Daily labs : Ordered    Medications:     cefTRIAXone 1 g Q12H     Current Facility-Administered Medications   Medication Dose Route   . acetaminophen  650 mg Oral   . morphine  4 mg Intravenous   . ondansetron  4 mg Oral    Or   . ondansetron  4 mg Intravenous     Physical exam:   Temp:  [96.2 F (35.7 C)-100.4 F (38 C)] 96.4 F (35.8 C)  Heart Rate:  [58-107] 58  Resp Rate:  [16-33] 17  BP: (100-120)/(59-74) 111/71 mmHg    Intake/Output Summary (Last 24 hours) at 11/30/15 1353  Last data filed at 11/30/15 1033   Gross per 24 hour   Intake   2740 ml   Output   1555 ml   Net   1185 ml     General: Awake, alert, oriented, no apparent distress.  Cardiovascular: Regular rate  and rhythm. No murmurs, gallops or rubs noted.  Lungs: Clear to auscultation bilaterally. No wheezing, crackles or rhonchi noted.  Abdomen: Soft, non-tender, non-distended. No organomegaly or masses noted. Normal bowel sounds. No guarding or rebound tenderness noted.  Extremities: No edema noted. 2+ pulses throughout.  Neuro: Non-focal neurological exam.      Labs (last 72 hours):     Recent Labs  Lab 11/30/15  0513 11/29/15  0653   WBC 12.47* 13.41*   HGB 13.4 13.2   HEMATOCRIT 40.3* 38.7*   PLATELETS 203 169       Recent Labs  Lab 11/30/15  0516 11/29/15  0653 11/28/15  1631   SODIUM 139 140 129*   POTASSIUM 3.8 3.5 3.8   CHLORIDE 107 109 92*   CO2 26 26 26    BUN 11 11 11    CREATININE 0.8 0.9 1.1   CALCIUM 8.2* 7.5* 8.3*   ALBUMIN  --   --  2.8*   PROTEIN, TOTAL  --   --  6.9   BILIRUBIN, TOTAL  --   --  1.8*   ALKALINE PHOSPHATASE  --   --  76   ALT  --   --  31   AST (SGOT)  --   --  44*   GLUCOSE 121* 113* 107*       Recent Labs  Lab 11/28/15  1630   PT 14.9   PT INR 1.2*   PTT 34       Radiology:     Radiology Results (24 Hour)     Procedure Component Value Units Date/Time    Fluoroscopy greater than 1 hour [[id number]] Collected:  11/29/15 1908    Order Status:  Completed Updated:  11/29/15 1912    Narrative:      CLINICAL INDICATION: PROCEDURE IN OPERATING ROOM      Impression:      FINDINGS/ Procedure performed using C-arm fluoroscopy. Please  see intraoperative note for more information.    Fluoroscopic image(s) :45     Fluoroscopic time: 1 minute and 43 seconds         Max Fickle, MD   11/29/2015 7:08 PM            Signed by: Theresia Lo, MD  Date/time: 11/30/2015 1:53 PM

## 2015-11-30 NOTE — Plan of Care (Signed)
Problem: Compromised skin integrity  Goal: Skin integrity is maintained or improved  Outcome: Progressing

## 2015-11-30 NOTE — Op Note (Signed)
Date Time: 11/29/2015 7:13 PM    Patient Name:   [name] [name]    Date of Operation:   11/29/2015    Providers Performing:   Surgeon(s):  Prescilla Sours, MD    Assistant (s):    Operative Procedure:   Procedure(s):  CYSTOSCOPY, URETEROSCOPY, RETROGRADE PYELOGRAM AND INSERTION OF LEFT DOUBLE J STENT .     Preoperative Diagnosis:   Pre-Op Diagnosis Codes:   * Ureteral obstruction [N13.5]   Hydronephrosis  Acute pyelonephritis    Postoperative Diagnosis:   Same as above    Anesthesia:   General    Estimated Blood Loss:   Minimal           Procedure:    The patient was given IV antibiotics preoperatively.  The patient was then brought to the cystoscopy suite and placed on the cysto table in the supine position.  The patient was then placed under general anesthesia via LMA.  The patient was then placed in the dorsal lithotomy position.  The patient was  prepped and draped in the usual sterile fashion.  A flouro image was obtained.   The 1 [place] Cystoscope was utilized with the 30 degree lens in place anterior urethra was traversed without difficulty and appeared to be normal in mucosa and caliber.  The posterior  urethra appeared to be normal with no obstructions  Both ureteral orifices were in the normal anatomic position.  No stones, bladder tumor or diverticulum were seen. A 0.035 guide wire was placed under fluoroscopy guidance into the ureter and and  renal pelvis The semirigid ureteroscope was then advanced into the ureter and into the renal pelvis revealed no stones , the calcification which was seen in CT scan was phlebolith not ureteral stone . Most li key patient acutr pyelonephritis with mild hydronephrosis. Therefore  A 26 cm, 7 Fr double JJ stent in left ureter was then inserted over the guidewire under fluoroscopic quidancend into the kidney.  There was a good coil in the kidney with good efflux into the bladder. The dangler was secured to dorsal aspect of penile shaft with  steri strips  The bladder was emptyied and the cystoscope was removed. 2% xylocaine jell was injected for pain control. The patient was  then transferred to the recover room in satisfactory  condition.

## 2015-11-30 NOTE — Plan of Care (Signed)
Problem: Safety  Goal: Patient will be free from infection during hospitalization  Outcome: Progressing

## 2015-11-30 NOTE — Plan of Care (Signed)
Problem: Safety  Goal: Patient will be free from injury during hospitalization  Outcome: Progressing

## 2015-11-30 NOTE — Plan of Care (Signed)
Problem: Pain  Goal: Pain at adequate level as identified by patient  Outcome: Progressing

## 2015-11-30 NOTE — Progress Note - Problem Oriented Charting Notewrit (Signed)
Dr. Karel Jarvis called and cleared pt for d/c home from the urology side. Paged and notified Dr. Nyra Capes, due to elevated WBC, pt will be d/c home tomorrow after the IV Rocephin with p.o antibiotic RX.

## 2015-11-30 NOTE — Plan of Care (Signed)
Problem: Moderate/High Fall Risk Score >5  Goal: Patient will remain free of falls  Outcome: Progressing

## 2015-11-30 NOTE — Plan of Care (Signed)
Problem: Bladder/Voiding  Goal: Perineal skin integrity is maintained or improved  Outcome: Progressing

## 2015-11-30 NOTE — Plan of Care (Signed)
Problem: Bladder/Voiding  Goal: Remains continent  Outcome: Progressing

## 2015-12-01 DIAGNOSIS — N132 Hydronephrosis with renal and ureteral calculous obstruction: Secondary | ICD-10-CM

## 2015-12-01 DIAGNOSIS — Z9889 Other specified postprocedural states: Secondary | ICD-10-CM

## 2015-12-01 LAB — BASIC METABOLIC PANEL
Anion Gap: 7 (ref 5.0–15.0)
BUN: 10 mg/dL (ref 9–28)
CO2: 27 mEq/L (ref 22–29)
Calcium: 8.2 mg/dL — ABNORMAL LOW (ref 8.5–10.5)
Chloride: 108 mEq/L (ref 100–111)
Creatinine: 0.8 mg/dL (ref 0.7–1.3)
Glucose: 85 mg/dL (ref 70–100)
Potassium: 3.4 mEq/L — ABNORMAL LOW (ref 3.5–5.1)
Sodium: 142 mEq/L (ref 136–145)

## 2015-12-01 LAB — CBC
Hematocrit: 41.7 % — ABNORMAL LOW (ref 42.0–52.0)
Hgb: 14.1 g/dL (ref 13.0–17.0)
MCH: 33.5 pg — ABNORMAL HIGH (ref 28.0–32.0)
MCHC: 33.8 g/dL (ref 32.0–36.0)
MCV: 99 fL (ref 80.0–100.0)
MPV: 9.1 fL — ABNORMAL LOW (ref 9.4–12.3)
Nucleated RBC: 0 /100 WBC (ref 0.0–1.0)
Platelets: 265 10*3/uL (ref 140–400)
RBC: 4.21 10*6/uL — ABNORMAL LOW (ref 4.70–6.00)
RDW: 13 % (ref 12–15)
WBC: 11.32 10*3/uL — ABNORMAL HIGH (ref 3.50–10.80)

## 2015-12-01 LAB — GFR: EGFR: >60

## 2015-12-01 MED ORDER — LACTATED RINGERS IV SOLN
INTRAVENOUS | Status: DC
Start: 2015-12-01 — End: 2015-12-01

## 2015-12-01 MED ORDER — POTASSIUM CHLORIDE CRYS ER 20 MEQ PO TBCR
40.0000 meq | EXTENDED_RELEASE_TABLET | Freq: Once | ORAL | Status: AC
Start: 2015-12-01 — End: 2015-12-01
  Administered 2015-12-01: 40 meq via ORAL
  Filled 2015-12-01: qty 2

## 2015-12-01 MED ORDER — CIPROFLOXACIN HCL 500 MG PO TABS
500.0000 mg | ORAL_TABLET | Freq: Two times a day (BID) | ORAL | Status: AC
Start: 2015-12-01 — End: 2015-12-08

## 2015-12-01 NOTE — Discharge Summary (Signed)
MEDICINE DISCHARGE SUMMARY    Date Time: 12/01/2015 2:54 PM  Patient Name: [name] [name],[name] [name]  Attending Physician: Carley Hammed, MD  Primary Care Physician: Christa See, MD    Date of Admission: 11/28/2015  Date of Discharge: 12/01/2015    Discharge Diagnoses:     Principal Diagnosis : Sepsis associated hypotension  Active Hospital Problems    Diagnosis POA   . S/P cystoscopy Not Applicable   . Nephrolithiasis Yes      Resolved Hospital Problems    Diagnosis POA   . Principal Problem: Sepsis associated hypotension Yes         Disposition:    Disposition: home with family    Pending Results, Recommendations & Instructions to providers after discharge:     1. Micro / Labs / Path pending:   Unresulted Labs     None        Ciprofloxacin for 7 days  Follow-up with urology for stent removal on Tuesday, July 11    Procedures/Radiology performed:   Radiology: all results from this admission  Ct Abd/pelvis Without Contrast    11/28/2015  1. Suspect 7 mm distal left renal calculus just proximal to the UVJ with moderate left hydration process. 2. Right perinephric stranding of uncertain etiology. No right ureteral calculus. Lorinda Creed, MD 11/28/2015 5:35 PM     Fluoroscopy Greater Than 1 Hour    11/29/2015  FINDINGS/ Procedure performed using C-arm fluoroscopy. Please see intraoperative note for more information. Fluoroscopic image(s) :45 Fluoroscopic time: 1 minute and 43 seconds  Max Fickle, MD 11/29/2015 7:08 PM     Surgery: all results from this admission  Procedure(s) with comments:  CYSTOSCOPY, URETEROSCOPY, RETROGRADE PYELOGRAM  AND INSERTION OF LEFT DOUBLE  J STENT .  (Left) - **IP-332-1/MD AVAIL 1600**     Hospital Course:     Reason for admission/ HPI: [name] [name] is a 35 y.o. male who presents to the hospital with 1 day of abdominal pain and nausea. SEE H&P FOR DETAILS.      Hospital Course:   Patient presents with sepsis secondary to acute pyelonephritis in the setting of obstructing left ureteral stone initiated  on IV antibiotics, IV fluids.  Consulted urology, underwent cystoscopy with ureteral stent placement.  Leukocytosis.  Improved symptoms resolved.  Patient was discharged on 7 more days of ciprofloxacin with plan to follow-up with Dr. Karel Jarvis on December 03, 2015 for stent removal.    Discharge condition: stable    Discharge Day Exam:  Today:  BP 110/59 mmHg  Pulse 55  Temp(Src) 98.4 F (36.9 C) (Oral)  Resp 16  Ht 1.803 m (5\' 11" )  Wt 77.565 kg (171 lb)  BMI 23.86 kg/m2  SpO2 98%  Ranges for the last 24 hours:  Temp:  [97.7 F (36.5 C)-100 F (37.8 C)] 98.4 F (36.9 C)  Heart Rate:  [55-66] 55  Resp Rate:  [16-18] 16  BP: (98-127)/(58-73) 110/59 mmHg  Body mass index is 23.86 kg/(m^2).      Intake/Output Summary (Last 24 hours) at 12/01/15 1454  Last data filed at 12/01/15 0543   Gross per 24 hour   Intake      0 ml   Output   1000 ml   Net  -1000 ml    General: awake, alert ,in no acute distress  Cardiovascular: regular rate and rhythm, no murmurs, rubs or gallops  Lungs: clear to auscultation bilaterally, no additional sounds  Abdomen: soft, non-tender, non-distended; normoactive bowel sounds  Extremities:  no edema  Neurological: Alert and oriented X 3, moves all extremities.           Wounds/decutibus ulcers/stage:    Consultations:   Treatment Team:   Attending Provider: Carley Hammed, MD  Consulting Physician: Prescilla Sours, MD  Recent Labs - Last 2:         Recent Labs  Lab 12/01/15  1335 11/30/15  0513 11/29/15  0653 11/28/15  1631   WBC 11.32* 12.47* 13.41* 18.80*   HGB 14.1 13.4 13.2 15.3   HEMATOCRIT 41.7* 40.3* 38.7* 43.8   PLATELETS 265 203 169 189      Recent Labs  Lab 11/28/15  1630   PT 14.9   PT INR 1.2*   PTT 34        Recent Labs  Lab 12/01/15  0508 11/30/15  0516 11/29/15  0653 11/28/15  1631   SODIUM 142 139 140 129*   POTASSIUM 3.4* 3.8 3.5 3.8   CHLORIDE 108 107 109 92*   CO2 27 26 26 26    BUN 10 11 11 11    CREATININE 0.8 0.8 0.9 1.1   EGFR >60.0 >60.0 >60.0 >60.0   GLUCOSE 85 121* 113*  107*   CALCIUM 8.2* 8.2* 7.5* 8.3*       Recent Labs  Lab 11/28/15  1631   ALKALINE PHOSPHATASE 76   BILIRUBIN, TOTAL 1.8*   PROTEIN, TOTAL 6.9   ALBUMIN 2.8*   ALT 31   AST (SGOT) 44*            Invalid input(s): FREET4         Microbiology Results     Procedure Component Value Units Date/Time    Blood Culture Aerobic/Anaerobic #1 [[id number]] Collected:  11/28/15 1630    Specimen Information:  Arm from Blood Updated:  11/30/15 2321    Narrative:      ORDER#: [id number]                                    ORDERED BY: [email or web address], ERUM  SOURCE: Blood arm                                    COLLECTED:  11/28/15 16:30  ANTIBIOTICS AT COLL.:                                RECEIVED :  11/28/15 23:00  Culture Blood Aerobic and Anaerobic        PRELIM      11/30/15 23:21  11/29/15   No Growth after 1 day/s of incubation.  11/30/15   No Growth after 2 day/s of incubation.      Blood Culture Aerobic/Anaerobic #2 [[id number]] Collected:  11/28/15 1630    Specimen Information:  Arm from Blood Updated:  11/30/15 2321    Narrative:      ORDER#: [id number]                                    ORDERED BY: [id number], ERUM  SOURCE: Blood arm  COLLECTED:  11/28/15 16:30  ANTIBIOTICS AT COLL.:                                RECEIVED :  11/28/15 23:00  Culture Blood Aerobic and Anaerobic        PRELIM      11/30/15 23:21  11/29/15   No Growth after 1 day/s of incubation.  11/30/15   No Growth after 2 day/s of incubation.      Urine culture [[id number]] Collected:  11/28/15 1630    Specimen Information:  Urine from Urine, Clean Catch Updated:  11/29/15 2256    Narrative:      ORDER#: [id number]                                    ORDERED BY: VHQIONGE, ERUM  SOURCE: Urine, Clean Catch                           COLLECTED:  11/28/15 16:30  ANTIBIOTICS AT COLL.:                                RECEIVED :  11/29/15 00:34  Culture Urine                              FINAL       11/29/15 22:56  11/29/15   No growth of >1,000  CFU/ML, No further work            Discharge Instructions & Follow Up Plan for Patient:   Discharge Diet: Diet regular  Activity/Weight Bearing Status: as tolerated   Patient was instructed to follow up with:     Follow-up Information     Follow up with Prescilla Sours, MD. Call today.    Specialty:  Urology    Why:  make an appointment for Tuesday    Contact information:    [address]  [address]  [place] [address]  [phone]            Complete instructions and follow up are in the patient's After Visit Summary    Minutes spent coordinating discharge and reviewing discharge plan:33 minutes    Discharge Medications:        Medication List      START taking these medications          ciprofloxacin 500 MG tablet   Commonly known as:  CIPRO   Take 1 tablet (500 mg total) by mouth 2 (two) times daily.            Where to Get Your Medications      You can get these medications from any pharmacy     Bring a paper prescription for each of these medications    - ciprofloxacin 500 MG tablet          Immunizations provided:   Immunization History   Administered Date(s) Administered   . Tdap 12/23/2014       [facility]  Department of Medicine  Signed by: Carley Hammed, MD    CC: Christa See, MD

## 2015-12-01 NOTE — Plan of Care (Signed)
Problem: Safety  Goal: Patient will be free from injury during hospitalization  Outcome: Progressing    12/01/15 0323   Goal/Interventions addressed this shift   Patient will be free from injury during hospitalization  Assess patient's risk for falls and implement fall prevention plan of care per policy;Provide and maintain safe environment;Use appropriate transfer methods;Ensure appropriate safety devices are available at the bedside;Hourly rounding;Provide alternative method of communication if needed [employer], writing);Include patient/ family/ care giver in decisions related to safety;Assess for patients risk for elopement and implement Elopement Risk Plan per policy       Goal: Patient will be free from infection during hospitalization  Outcome: Progressing    12/01/15 0323   Goal/Interventions addressed this shift   Free from Infection during hospitalization Assess and monitor for signs and symptoms of infection;Monitor lab/diagnostic results;Monitor all insertion sites (i.e. indwelling lines, tubes, urinary catheters, and drains)         Problem: Pain  Goal: Pain at adequate level as identified by patient  Outcome: Progressing  Denied any pain or discomfort     12/01/15 0323   Goal/Interventions addressed this shift   Pain at adequate level as identified by patient Identify patient comfort function goal;Reassess pain within 30-60 minutes of any procedure/intervention, per Pain Assessment, Intervention, Reassessment (AIR) Cycle;Assess for risk of opioid induced respiratory depression, including snoring/sleep apnea. Alert healthcare team of risk factors identified.;Evaluate if patient comfort function goal is met;Evaluate patient's satisfaction with pain management progress;Offer non-pharmacological pain management interventions;Consult/collaborate with Pain Service         Problem: Side Effects from Pain Analgesia  Goal: Patient will experience minimal side effects of analgesic therapy  Outcome:  Progressing    12/01/15 0323   Goal/Interventions addressed this shift   Patient will experience minimal side effects of analgesic therapy Monitor/assess patient's respiratory status (RR depth, effort, breath sounds);Prevent/manage side effects per LIP orders (i.e. nausea, vomiting, pruritus, constipation, urinary retention, etc.);Evaluate for opioid-induced sedation with appropriate assessment tool (i.e. POSS)         Problem: Discharge Barriers  Goal: Patient will be discharged home or other facility with appropriate resources  Outcome: Progressing    12/01/15 0323   Goal/Interventions addressed this shift   Discharge to home or other facility with appropriate resources Provide appropriate patient education;Provide information on available health resources         Problem: Psychosocial and Spiritual Needs  Goal: Demonstrates ability to cope with hospitalization/illness  Outcome: Progressing    12/01/15 0323   Goal/Interventions addressed this shift   Demonstrates ability to cope with hospitalizations/illness Encourage verbalization of feelings/concerns/expectations;Provide quiet environment;Assist patient to identify own strengths and abilities;Encourage patient to set small goals for self;Encourage participation in diversional activity         Problem: Moderate/High Fall Risk Score >5  Goal: Patient will remain free of falls  Outcome: Progressing    Problem: Bladder/Voiding  Goal: Remains continent  Outcome: Progressing  Pt voiding good amt of clear urine, denied any burning or discomfort, will continue to monitor     12/01/15 0323   Goal/Interventions addressed this shift   Remains continent  Initiate bladder training program;Encourage toileting;Monitor intake and output;Encourage patient to empty bladder at regular intervals;Encourage patient to call for help when getting up to use the bathroom       Goal: Perineal skin integrity is maintained or improved  Outcome: Progressing    12/01/15 0323    Goal/Interventions addressed this shift   Perineal skin  integrity is maintained or improved Keep intact skin clean and dry;Use protective skin barriers to decrease potential skin breakdown       Goal: Free from infection  Outcome: Progressing  Goal: Patient will experience proper bladder emptying during admission  Outcome: Progressing    12/01/15 0323   Goal/Interventions addressed this shift   Patient will experience proper bladder emptying during admission Monitor intake and output;Encourage patient to empty bladder at regular intervals         Problem: Knowledge Deficit, Education, Discharge Plan  Goal: Verbalizes understanding of medication, benefits, and side effects  Outcome: Progressing    12/01/15 0323   Goal/Interventions addressed this shift   Verbalizes understanding of medication, benefits, and side effects Ensure Informed consent for all psychotropic medications;Provide medication teaching including name, dosage, benefits, action, effect and side effects;Encourage to report response to medications including side effects       Goal: Verbalizes understanding of signs/symptoms of illness  Outcome: Progressing    Problem: Inadequate Cardiac Output  Goal: Adequate tissue perfusion will be maintained  Outcome: Completed Date Met:  12/01/15    Problem: Infection  Goal: Free from infection  Outcome: Progressing    12/01/15 0323   OTHER   Free from infection  Assess for signs/symptoms of infection         Problem: Compromised skin integrity  Goal: Skin integrity is maintained or improved  Outcome: Progressing    12/01/15 0323   Goal/Interventions addressed this shift   Skin integrity is maintained or improved Monitor patient's hygiene practices;Assess Braden Scale every shift         Problem: Nutrition  Goal: Nutritional intake is adequate  Outcome: Progressing    12/01/15 0323   Goal/Interventions addressed this shift   Nutritional intake is adequate Allow adequate time for meals;Monitor daily weights;Assist  patient with meals/food selection

## 2015-12-01 NOTE — Plan of Care (Signed)
Problem: Pain  Goal: Pain at adequate level as identified by patient  Outcome: Adequate for Discharge    Problem: Psychosocial and Spiritual Needs  Goal: Demonstrates ability to cope with hospitalization/illness  Outcome: Adequate for Discharge    Problem: Moderate/High Fall Risk Score >5  Goal: Patient will remain free of falls  Outcome: Adequate for Discharge    Problem: Bladder/Voiding  Goal: Remains continent  Outcome: Adequate for Discharge  Goal: Free from infection  Outcome: Adequate for Discharge  Goal: Patient will experience proper bladder emptying during admission  Outcome: Adequate for Discharge    Comments:   Patient met goals for discharge.  Discussed discharge instructions with patient.

## 2015-12-02 ENCOUNTER — Encounter: Payer: Self-pay | Admitting: Urology

## 2015-12-10 ENCOUNTER — Ambulatory Visit (INDEPENDENT_AMBULATORY_CARE_PROVIDER_SITE_OTHER): Payer: Charity | Admitting: Nurse Practitioner

## 2017-08-31 ENCOUNTER — Encounter (HOSPITAL_COMMUNITY): Payer: Self-pay | Admitting: Emergency Medicine

## 2017-08-31 ENCOUNTER — Emergency Department (HOSPITAL_COMMUNITY)
Admission: EM | Admit: 2017-08-31 | Discharge: 2017-08-31 | Disposition: A | Discharge status: Home / Self Care | Payer: Self-pay | Attending: Emergency Medicine | Admitting: Emergency Medicine

## 2017-08-31 ENCOUNTER — Emergency Department (HOSPITAL_COMMUNITY): Payer: Self-pay

## 2017-08-31 DIAGNOSIS — K047 Periapical abscess without sinus: Secondary | ICD-10-CM | POA: Insufficient documentation

## 2017-08-31 DIAGNOSIS — F17210 Nicotine dependence, cigarettes, uncomplicated: Secondary | ICD-10-CM | POA: Insufficient documentation

## 2017-08-31 HISTORY — DX: Gout, unspecified: M10.9

## 2017-08-31 LAB — CBC WITH DIFFERENTIAL/PLATELET
Basophils Absolute: 0 10*3/uL (ref 0.0–0.1)
Basophils Relative: 0 %
Eosinophils Absolute: 0 10*3/uL (ref 0.0–0.7)
Eosinophils Relative: 0 %
HCT: 51.7 % (ref 39.0–52.0)
Hemoglobin: 18.2 g/dL — ABNORMAL HIGH (ref 13.0–17.0)
Lymphocytes Relative: 11 %
Lymphs Abs: 1.9 10*3/uL (ref 0.7–4.0)
MCH: 34.3 pg — ABNORMAL HIGH (ref 26.0–34.0)
MCHC: 35.2 g/dL (ref 30.0–36.0)
MCV: 97.5 fL (ref 78.0–100.0)
Monocytes Absolute: 1 10*3/uL (ref 0.1–1.0)
Monocytes Relative: 6 %
Neutro Abs: 14.2 10*3/uL — ABNORMAL HIGH (ref 1.7–7.7)
Neutrophils Relative %: 83 %
Platelets: 184 10*3/uL (ref 150–400)
RBC: 5.3 MIL/uL (ref 4.22–5.81)
RDW: 13.6 % (ref 11.5–15.5)
WBC: 17.1 10*3/uL — ABNORMAL HIGH (ref 4.0–10.5)

## 2017-08-31 LAB — BASIC METABOLIC PANEL
Anion gap: 9 (ref 5–15)
BUN: 6 mg/dL (ref 6–20)
CO2: 26 mmol/L (ref 22–32)
Calcium: 9.4 mg/dL (ref 8.9–10.3)
Chloride: 101 mmol/L (ref 101–111)
Creatinine, Ser: 0.92 mg/dL (ref 0.61–1.24)
GFR calc Af Amer: >60 mL/min (ref >60)
GFR calc non Af Amer: >60 mL/min (ref >60)
Glucose, Bld: 108 mg/dL — ABNORMAL HIGH (ref 65–99)
Potassium: 3.6 mmol/L (ref 3.5–5.1)
Sodium: 136 mmol/L (ref 135–145)

## 2017-08-31 MED ORDER — IOPAMIDOL (ISOVUE-300) INJECTION 61%
INTRAVENOUS | Status: AC
  Filled 2017-08-31: qty 100

## 2017-08-31 MED ORDER — AMOXICILLIN-POT CLAVULANATE 875-125 MG PO TABS
1.0000 | ORAL_TABLET | Freq: Once | ORAL | Status: AC
  Administered 2017-08-31: 1 via ORAL
  Filled 2017-08-31: qty 1

## 2017-08-31 MED ORDER — AMOXICILLIN-POT CLAVULANATE 875-125 MG PO TABS: 1.0000 | ORAL_TABLET | Freq: Two times a day (BID) | ORAL | 0 refills | Status: AC

## 2017-08-31 MED ORDER — MORPHINE SULFATE (PF) 4 MG/ML IV SOLN
4.0000 mg | Freq: Once | INTRAVENOUS | Status: DC
  Filled 2017-08-31: qty 1

## 2017-08-31 MED ORDER — IOPAMIDOL (ISOVUE-300) INJECTION 61%
75.0000 mL | Freq: Once | INTRAVENOUS | Status: AC | PRN
  Administered 2017-08-31: 75 mL via INTRAVENOUS

## 2017-08-31 MED ORDER — OXYCODONE-ACETAMINOPHEN 5-325 MG PO TABS: 1.0000 | ORAL_TABLET | Freq: Three times a day (TID) | ORAL | 0 refills | Status: AC | PRN

## 2017-08-31 NOTE — ED Triage Notes (Signed)
Pt states he started having a toothache two days ago. Pt taking ibuprofen for pain and orajel. Pt has two cracked molars on the right lower. Now pt reports swelling to the right lower jaw and pain.

## 2017-08-31 NOTE — ED Notes (Signed)
Patient transported to CT 

## 2017-08-31 NOTE — ED Provider Notes (Signed)
[facility]   CSN: [id number] Arrival date & time: 08/31/17  [id number]    History   Chief Complaint Chief Complaint  Patient presents with  . Dental Pain    HPI Kenneth Best is a 37 y.o. male.  HPI   37 year old male presents today with complaints of dental pain.  Patient reports 2 days ago he had pain in his right posterior jaw.  He notes putting Orajel on the tooth with no significant improvement in symptoms.  Patient notes over the last several days he has had worsening swelling along the jaw in the submandibular region.  Patient denies any fevers at home, denies any pain to the throat.  Patient denies any difficulty swallowing, breathing, or talking.  Patient denies any chronic health conditions.  He notes he took several doses amoxicillin that he had at home which improved his pain.     Past Medical History:  Diagnosis Date  . Gout     There are no active problems to display for this patient.   History reviewed. No pertinent surgical history.      Home Medications    Prior to Admission medications   Medication Sig Start Date End Date Taking? Authorizing Provider  amoxicillin-clavulanate (AUGMENTIN) 875-125 MG tablet Take 1 tablet by mouth every 12 (twelve) hours. 08/31/17   Kenneth Best, Kenneth GensJeffrey, PA-C  oxyCODONE-acetaminophen (PERCOCET/ROXICET) 5-325 MG tablet Take 1 tablet by mouth every 8 (eight) hours as needed for severe pain. 08/31/17   Kenneth Best, Kenneth Bosko, PA-C    Family History History reviewed. No pertinent family history.  Social History Social History   Tobacco Use  . Smoking status: Current Every Day Smoker    Types: Cigarettes  . Smokeless tobacco: Never Used  Substance Use Topics  . Alcohol use: Yes    Comment: occ  . Drug use: Not on file     Allergies   Patient has no known allergies.   Review of Systems Review of Systems  All other systems reviewed and are negative.    Physical Exam Updated  Vital Signs BP (!) 116/91 (BP Location: Right Arm)   Pulse 83   Temp 98.8 F (37.1 C) (Oral)   Resp 18   Ht 5\' 11"  (1.803 m)   Wt 86.2 kg (190 lb)   SpO2 99%   BMI 26.50 kg/m   Physical Exam  Constitutional: He is oriented to person, place, and time. He appears well-developed and well-nourished.  HENT:  Head: Normocephalic and atraumatic.  Numerous dental caries throughout, minor right-sided induration along the buccal mucosa-no fluctuance noted no significant tenderness floor the mouth is soft jaw full active range of motion-induration noted to the right some mandibular region with minor tenderness to palpation, minor tenderness palpation of the ankle of the mandible-neck is supple with full active range of motion  Eyes: Pupils are equal, round, and reactive to light. Conjunctivae are normal. Right eye exhibits no discharge. Left eye exhibits no discharge. No scleral icterus.  Neck: Normal range of motion. No JVD present. No tracheal deviation present.  Pulmonary/Chest: Effort normal. No stridor.  Neurological: He is alert and oriented to person, place, and time. Coordination normal.  Psychiatric: He has a normal mood and affect. His behavior is normal. Judgment and thought content normal.  Nursing note and vitals reviewed.    ED Treatments / Results  Labs (all labs ordered are listed, but only abnormal results are displayed) Labs Reviewed  CBC WITH DIFFERENTIAL/PLATELET - Abnormal; Notable  for the following components:      Result Value   WBC 17.1 (*)    Hemoglobin 18.2 (*)    MCH 34.3 (*)    Neutro Abs 14.2 (*)    All other components within normal limits  BASIC METABOLIC PANEL - Abnormal; Notable for the following components:   Glucose, Bld 108 (*)    All other components within normal limits    EKG None  Radiology Ct Soft Tissue Neck W Contrast  Result Date: 08/31/2017 CLINICAL DATA:  Mass, lump, or swelling of face. Right mandibular mass with increased size since  yesterday. EXAM: CT NECK WITH CONTRAST TECHNIQUE: Multidetector CT imaging of the neck was performed using the standard protocol following the bolus administration of intravenous contrast. CONTRAST:  75mL ISOVUE-300 IOPAMIDOL (ISOVUE-300) INJECTION 61% COMPARISON:  None. FINDINGS: Pharynx and larynx: No focal mucosal or submucosal lesions are present. Vocal cords are midline and symmetric. Trachea is within normal limits. Salivary glands: Inflammatory changes extend into the right submandibular gland. There is some asymmetric edema within the right gland. No intrinsic mass lesion is present. The parotid glands are normal bilaterally. Thyroid: Normal. Lymph nodes: Asymmetric right level 2 and submandibular enlarged lymph nodes are reactive. Vascular: Negative. Limited intracranial: Within normal limits. Visualized orbits: Unremarkable. Mastoids and visualized paranasal sinuses: Mild mucosal thickening is present along the floor of the maxillary sinuses bilaterally. The remaining visualized paranasal sinuses and mastoid air cells are clear. Skeleton: Mild endplate changes and uncovertebral spurring is present C3-4, C4-5 C5-6, and C6-7. Definite stenosis is present. Prominent dental caries are present in the second right mandibular premolar tooth and the second right mandibular molar. Periapical lucencies are present about both of these teeth. There is a cortical disruption along the inferior and gingival surface of the mandible. A 1.5 cm subperiosteal abscess is noted along the underside of the right mandible. This extends to the angle of the mandible. Upper chest: The lung apices are clear. The thoracic inlet is within normal limits. Other: There diffuse inflammatory changes within the right submandibular space and thickening of the platysma the right. Subcutaneous inflammatory changes are predominantly on the right but do cross midline. No other soft tissue abscess is present. IMPRESSION: 1. Large dental caries  periapical lucency and associated subperiosteal abscess involving the second right mandibular molar. This is the center of inflammation associated with the right-sided swelling. 2. Multiple enlarged reactive lymph nodes within the right submandibular and level 2 stations. 3. Additional prominent dental caries and periapical lucency involving the right second premolar tooth of the mandible. 4. No other soft tissue mass or abscess. Electronically Signed   By: Marin Roberts M.D.   On: 08/31/2017 16:08    Procedures Procedures (including critical care time)  Medications Ordered in ED Medications  morphine 4 MG/ML injection 4 mg (0 mg Intravenous Hold 08/31/17 1525)  iopamidol (ISOVUE-300) 61 % injection (has no administration in time range)  iopamidol (ISOVUE-300) 61 % injection 75 mL (75 mLs Intravenous Contrast Given 08/31/17 1536)  amoxicillin-clavulanate (AUGMENTIN) 875-125 MG per tablet 1 tablet (1 tablet Oral Given 08/31/17 1646)     Initial Impression / Assessment and Plan / ED Course  I have reviewed the triage vital signs and the nursing notes.  Pertinent labs & imaging results that were available during my care of the patient were reviewed by me and considered in my medical decision making (see chart for details).      Final Clinical Impressions(s) / ED Diagnoses  Final diagnoses:  Dental abscess    Labs: CBC, BMP  Imaging: CT neck soft tissue  Consults:  Therapeutics: Augmentin  Discharge Meds: Augmentin, Percocet  Assessment/Plan: 37 year old male presents today with complaints of dental pain and infection.  Patient has what appears to be a dental abscess.  This does not appear to be amenable at this time.  Patient does have reactive lymph nodes which appear to be causing the majority of his swelling on my clinical exam.  Patient is afebrile he does have elevation in his white count.  He is otherwise healthy with no chronic health conditions.  I feel patient is stable  for discharge with oral antibiotics and close outpatient follow-up with oral surgery.  Patient will call on-call oral surgeon tomorrow morning to schedule follow-up evaluation he is instructed to return to the emergency room with any new or worsening signs or symptoms including worsening swelling, fever, worsening pain.  Patient verbalizes understanding and agreement to today's plan had no further questions or concerns at time of discharge.      ED Discharge Orders        Ordered    amoxicillin-clavulanate (AUGMENTIN) 875-125 MG tablet  Every 12 hours     08/31/17 1639    oxyCODONE-acetaminophen (PERCOCET/ROXICET) 5-325 MG tablet  Every 8 hours PRN     08/31/17 1639       Kenneth Mechanic, PA-C 08/31/17 1707    Mancel Bale, MD 08/31/17 2140

## 2017-08-31 NOTE — ED Notes (Signed)
ED Provider at bedside. 

## 2017-08-31 NOTE — Discharge Instructions (Addendum)
Please read attached information. If you experience any new or worsening signs or symptoms please return to the emergency room for evaluation. Please follow-up with your primary care provider or specialist as discussed. Please use medication prescribed only as directed and discontinue taking if you have any concerning signs or symptoms.   °

## 2017-08-31 NOTE — ED Notes (Signed)
Notified CT that pt was ready for scan.

## 2017-08-31 NOTE — ED Notes (Signed)
Pt states that he has a broken tooth x1 year, and has been taking amoxicillin and ibuprofen for the pain. States he put oragel on the area last night and had increased swelling to the lower jaw. Pt states that he has been taking amoxicillin for one year "whenever I have pain in my tooth." Pt educated on the proper use of antibiotics and how resistance can be created. Also educated that antibiotics expire.

## 2017-09-06 ENCOUNTER — Other Ambulatory Visit: Payer: Self-pay

## 2017-09-06 ENCOUNTER — Emergency Department (HOSPITAL_COMMUNITY)
Admission: EM | Admit: 2017-09-06 | Discharge: 2017-09-07 | Disposition: A | Discharge status: Other Acute Inpatient Hospital | Payer: Self-pay | Attending: Emergency Medicine | Admitting: Emergency Medicine

## 2017-09-06 DIAGNOSIS — Z79899 Other long term (current) drug therapy: Secondary | ICD-10-CM | POA: Insufficient documentation

## 2017-09-06 DIAGNOSIS — F17210 Nicotine dependence, cigarettes, uncomplicated: Secondary | ICD-10-CM | POA: Insufficient documentation

## 2017-09-06 DIAGNOSIS — K047 Periapical abscess without sinus: Secondary | ICD-10-CM | POA: Insufficient documentation

## 2017-09-06 LAB — CBC
HEMATOCRIT: 45.4 % (ref 39.0–52.0)
Hemoglobin: 15.3 g/dL (ref 13.0–17.0)
MCH: 32.6 pg (ref 26.0–34.0)
MCHC: 33.7 g/dL (ref 30.0–36.0)
MCV: 96.6 fL (ref 78.0–100.0)
Platelets: 303 10*3/uL (ref 150–400)
RBC: 4.7 MIL/uL (ref 4.22–5.81)
RDW: 13.1 % (ref 11.5–15.5)
WBC: 17.8 10*3/uL — AB (ref 4.0–10.5)

## 2017-09-06 LAB — BASIC METABOLIC PANEL
Anion gap: 13 (ref 5–15)
BUN: <5 mg/dL — ABNORMAL LOW (ref 6–20)
CO2: 22 mmol/L (ref 22–32)
Calcium: 9 mg/dL (ref 8.9–10.3)
Chloride: 103 mmol/L (ref 101–111)
Creatinine, Ser: 0.89 mg/dL (ref 0.61–1.24)
GFR calc Af Amer: >60 mL/min (ref >60)
GFR calc non Af Amer: >60 mL/min (ref >60)
GLUCOSE: 128 mg/dL — AB (ref 65–99)
POTASSIUM: 3.4 mmol/L — AB (ref 3.5–5.1)
Sodium: 138 mmol/L (ref 135–145)

## 2017-09-06 NOTE — ED Triage Notes (Signed)
Pt has large abscess on right side of jaw and neck. Pt was seen on 4/09 and had CT scan per pt the swelling is "much worse". Pt denies any trouble breathing, air way intact. Pt states its difficult to open mouth to eat.

## 2017-09-07 MED ORDER — MORPHINE SULFATE (PF) 4 MG/ML IV SOLN
4.0000 mg | Freq: Once | INTRAVENOUS | Status: AC
Start: 2017-09-07 — End: 2017-09-07
  Administered 2017-09-07: 4 mg via INTRAVENOUS
  Filled 2017-09-07: qty 1

## 2017-09-07 MED ORDER — CLINDAMYCIN PHOSPHATE 600 MG/50ML IV SOLN
600.0000 mg | Freq: Once | INTRAVENOUS | Status: AC
  Administered 2017-09-07: 600 mg via INTRAVENOUS
  Filled 2017-09-07: qty 50

## 2017-09-07 MED ORDER — DEXAMETHASONE SODIUM PHOSPHATE 10 MG/ML IJ SOLN
10.0000 mg | Freq: Once | INTRAMUSCULAR | Status: AC
  Administered 2017-09-07: 10 mg via INTRAVENOUS
  Filled 2017-09-07: qty 1

## 2017-09-07 NOTE — ED Notes (Signed)
[facility] called for transport to [facility] 

## 2017-09-07 NOTE — ED Provider Notes (Signed)
Medical screening examination/treatment/procedure(s) were conducted as a shared visit with non-physician practitioner(s) and myself.  I personally evaluated the patient during the encounter.  None   Patient is a 37 year old male with no significant past medical history who presents to the emergency department with increasing right-sided facial swelling.  Patient seen on the emergency department on April 9 and found to have large dental caries with an associated subperiosteal abscess involving the second right mandibular molar.  Abscess seems to have extended now to the surface outside of the jaw and neck.  He does not have Ludwick's angina currently but does have significant facial cellulitis.  Will give Decadron and clindamycin here as well as pain medication.  Unfortunately at this time we do not have a dentist or oral surgeon on call.  I feel he will need to be seen by a dentist or OMFS as well as ENT.  [facility]  [facility] agrees to accept patient in transfer.  Patient has normal phonation.  No stridor or drooling.  Does have some trismus secondary to pain.  His tongue does sit flat in the bottom of his mouth.   Ellakate Gonsalves, Layla MawKristen N, DO 09/07/17 0205

## 2017-09-07 NOTE — ED Provider Notes (Signed)
[facility] EMERGENCY DEPARTMENT Provider Note   CSN: [id number] Arrival date & time: 09/06/17  1439     History   Chief Complaint Chief Complaint  Patient presents with  . Abscess    HPI Kenneth Best is a 37 y.o. male.  The history is provided by the patient and medical records.  Abscess     37 y.o. M here with right sided facial swelling.  He was seen here on 08/31/17 and had CT of neck which showed 1.5 cm abscess. States he was prescribed amoxicillin and motrin which he has been taking but symptoms have been steadily worsening.  States now his face feels at least 5x the size.  States about 3 days ago he started having difficulty swallowing-- states food does not seem to get stuck, but he feels like his throat is a little restricted.  He is tolerating liquids well.  States now his entire neck is sore/stiff and is turning red.  He was told to follow-up with oral surgeon but does not drive or have financial means to do this.  States he was not sure what else to do so he came back.  He denies fever/chills.  No nausea/vomiting.    Past Medical History:  Diagnosis Date  . Gout     There are no active problems to display for this patient.   No past surgical history on file.      Home Medications    Prior to Admission medications   Medication Sig Start Date End Date Taking? Authorizing Provider  amoxicillin-clavulanate (AUGMENTIN) 875-125 MG tablet Take 1 tablet by mouth every 12 (twelve) hours. 08/31/17   Hedges, Tinnie Gens, PA-C  oxyCODONE-acetaminophen (PERCOCET/ROXICET) 5-325 MG tablet Take 1 tablet by mouth every 8 (eight) hours as needed for severe pain. 08/31/17   Eyvonne Mechanic, PA-C    Family History No family history on file.  Social History Social History   Tobacco Use  . Smoking status: Current Every Day Smoker    Types: Cigarettes  . Smokeless tobacco: Never Used  Substance Use Topics  . Alcohol use: Yes    Comment: occ  . Drug use: Not on  file     Allergies   Patient has no known allergies.   Review of Systems Review of Systems  HENT: Positive for dental problem and facial swelling.   All other systems reviewed and are negative.    Physical Exam Updated Vital Signs BP (!) 148/93   Pulse 77   Temp 98.4 F (36.9 C) (Oral)   Resp 18   SpO2 96%   Physical Exam  Constitutional: He is oriented to person, place, and time. He appears well-developed and well-nourished.  HENT:  Head: Normocephalic and atraumatic.  Mouth/Throat: Oropharynx is clear and moist.  Teeth largely in poor dentition, right lower molars are broken and decayed, surrounding gingiva swollen without drainable fluid collection inside oral cavity, handling secretions appropriately, mild trismus due to pain, significant right sided cheek swelling with extension into the neck; neck is somewhat tense and very tender to palpation; airway patent, no stridor, no tracheal deviation, tongue laying flat in the mouth  Eyes: Pupils are equal, round, and reactive to light. Conjunctivae and EOM are normal.  Neck: Normal range of motion.  Cardiovascular: Normal rate, regular rhythm and normal heart sounds.  Pulmonary/Chest: Effort normal and breath sounds normal.  Abdominal: Soft. Bowel sounds are normal.  Musculoskeletal: Normal range of motion.  Neurological: He is alert and oriented to person,  place, and time.  Skin: Skin is warm and dry.  Psychiatric: He has a normal mood and affect.  Nursing note and vitals reviewed.    ED Treatments / Results  Labs (all labs ordered are listed, but only abnormal results are displayed) Labs Reviewed  CBC - Abnormal; Notable for the following components:      Result Value   WBC 17.8 (*)    All other components within normal limits  BASIC METABOLIC PANEL - Abnormal; Notable for the following components:   Potassium 3.4 (*)    Glucose, Bld 128 (*)    BUN <5 (*)    All other components within normal limits     EKG None  Radiology No results found.  Procedures Procedures (including critical care time)  Medications Ordered in ED Medications  clindamycin (CLEOCIN) IVPB 600 mg (0 mg Intravenous Stopped 09/07/17 0337)  dexamethasone (DECADRON) injection 10 mg (10 mg Intravenous Given 09/07/17 0209)  morphine 4 MG/ML injection 4 mg (4 mg Intravenous Given 09/07/17 0209)     Initial Impression / Assessment and Plan / ED Course  I have reviewed the triage vital signs and the nursing notes.  Pertinent labs & imaging results that were available during my care of the patient were reviewed by me and considered in my medical decision making (see chart for details).  37 y.o. M here with right sided facial swelling.  Seen here a few days ago, diagnosed with dental abscess by CT. has been on antibiotics but reports worsening infection.  Patient is afebrile and nontoxic.  He does have extensive swelling of the right lower cheek with extension of the neck.  Neck is starting to feel brawny.  He is handling his secretions well, normal phonation without stridor.  Does report some development of difficulty swallowing, mostly with solid foods.  He has not had any trouble with liquids.  Labs today with significant leukocytosis, otherwise reassuring.  Patient does not clinically have any signs of Ludwig's angina at this time, however with worsening swelling despite abx therapy, potential for this to develop.  He will need intervention by oral surgery/dentistry, however no one on call at this facility.  [facility]  Start IV clindamycin and decadron.  1:41 AM Discussed with [facility], Dr. Jearld AdjutantMarcus Magister-- feels transfer is appropriate.  Agrees with IV clindamycin and decadron.  Likely will need repeat CT but will get that when he arrives in ED at [facility].  ENT and oral surgery to evaluate and intervene as needed.  CT to make copies of CT and push through system to make visible for  physicians at [facility].  Final Clinical Impressions(s) / ED Diagnoses   Final diagnoses:  Dental abscess    ED Discharge Orders    None       Garlon HatchetSanders, Lisa M, PA-C 09/07/17 [phone]

## 2018-10-05 IMAGING — CT CT NECK W/ CM
4 of 5 series · 14 of 33 positions shown, 16 images · IV contrast (Omni 300)
Comparison: None.

CLINICAL DATA: Mass, lump, or swelling of face. Right mandibular
mass with increased size since yesterday.

EXAM:
CT NECK WITH CONTRAST
TECHNIQUE: Multidetector CT imaging of the neck was performed using the
standard protocol following the bolus administration of intravenous
contrast.
CONTRAST:  75mL 7GSH8Y-IMM IOPAMIDOL (7GSH8Y-IMM) INJECTION 61%

[Series 3: neck 2.0 st · axial · 0.53mm/px · z∈[-205,-55]mm · 4 of 127 slices shown, 5 images (1 of 3)]
[im 26/127  soft-tissue]
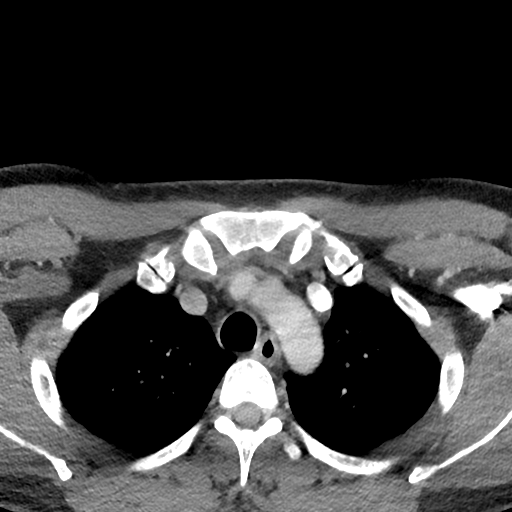
[im 26/127  bone]
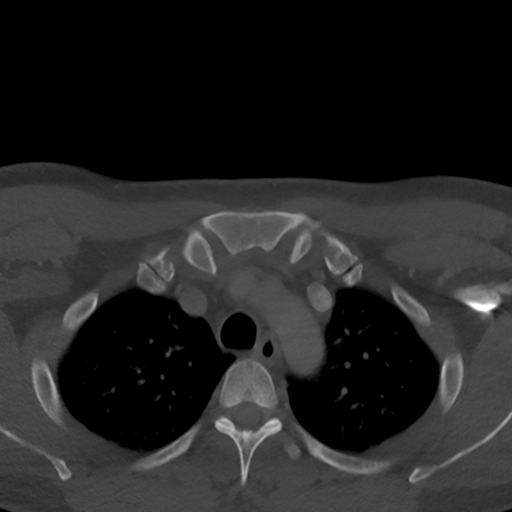
[im 51/127  bone]
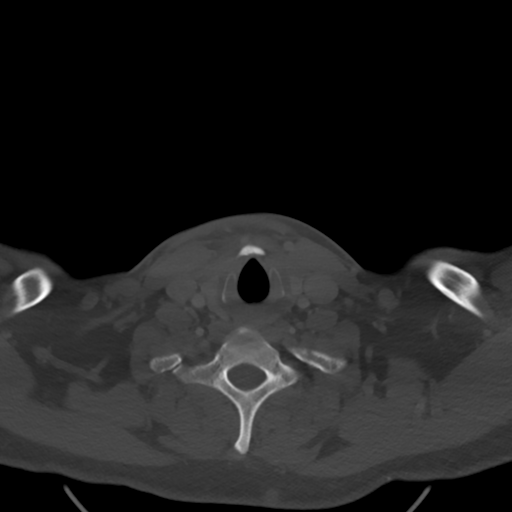
[im 76/127  bone]
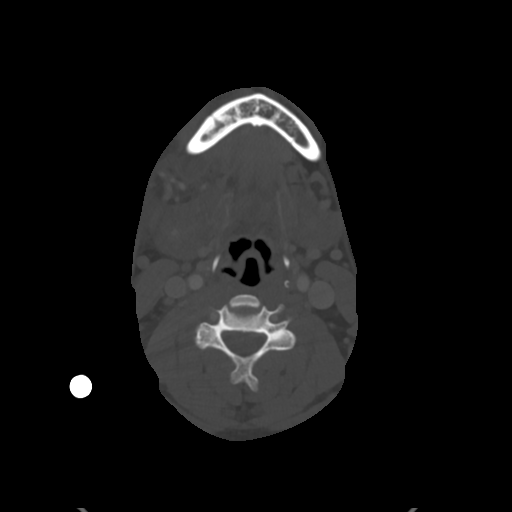
[im 101/127  bone]
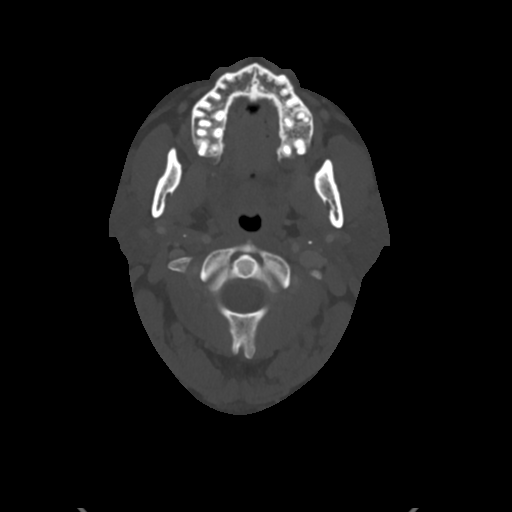

[Series 5: neck 2.0 st · sagittal · 0.50mm/px · 5 of 101 slices shown, 6 images (2 of 3)]
[im 34/101  bone]
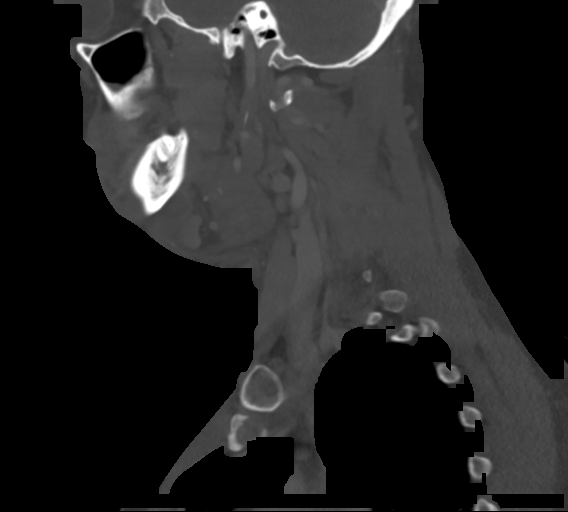
[im 42/101  bone]
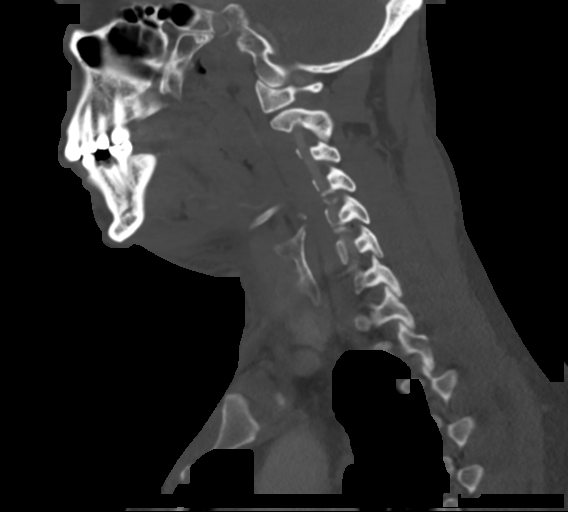
[im 51/101  soft-tissue]
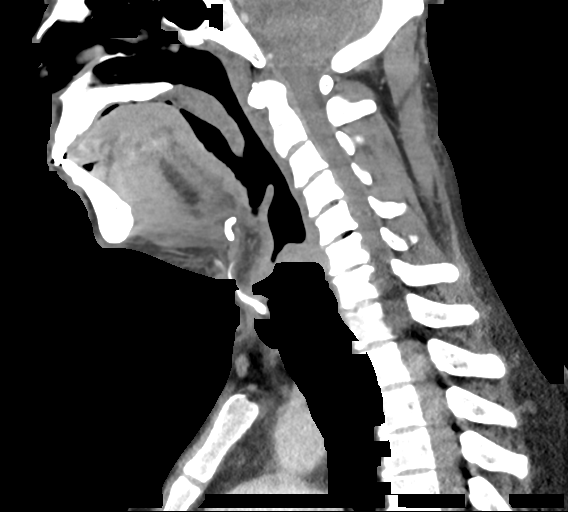
[im 51/101  bone]
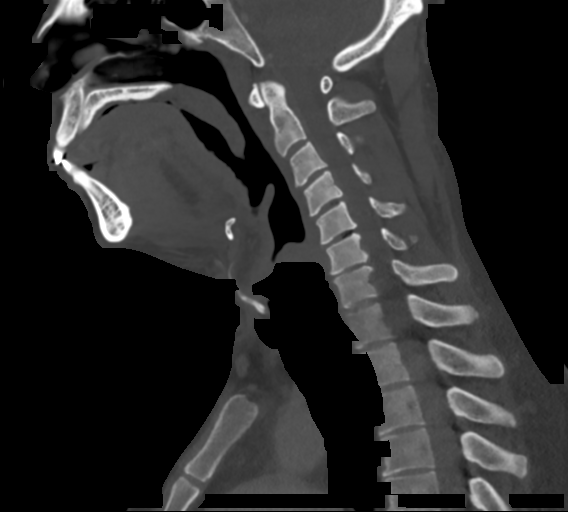
[im 59/101  bone]
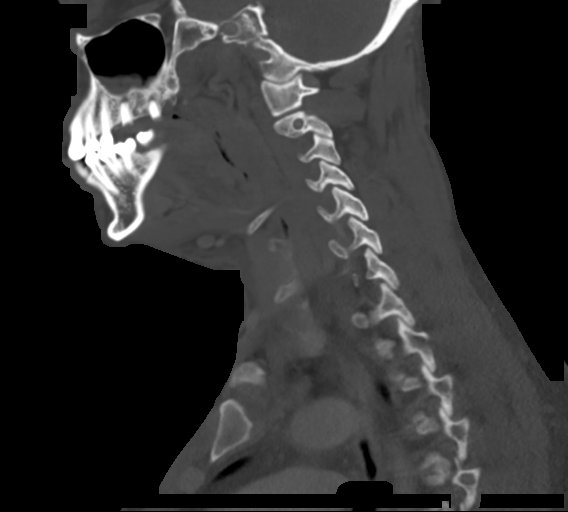
[im 67/101  bone]
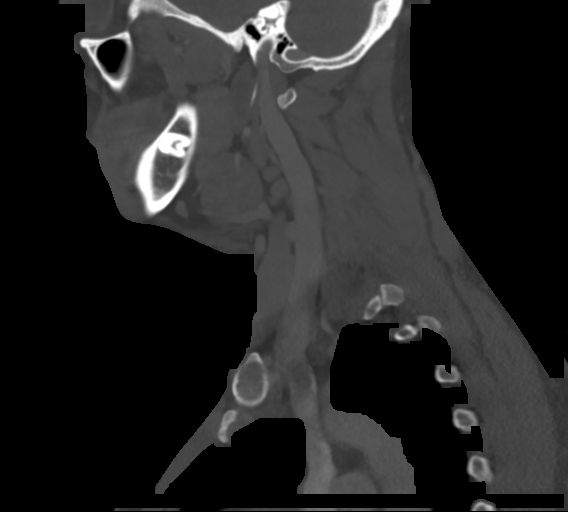

[Series 6: neck 2.0 st · coronal · 0.43mm/px · 3 of 130 slices shown (3 of 3)]
[im 26/130  bone]
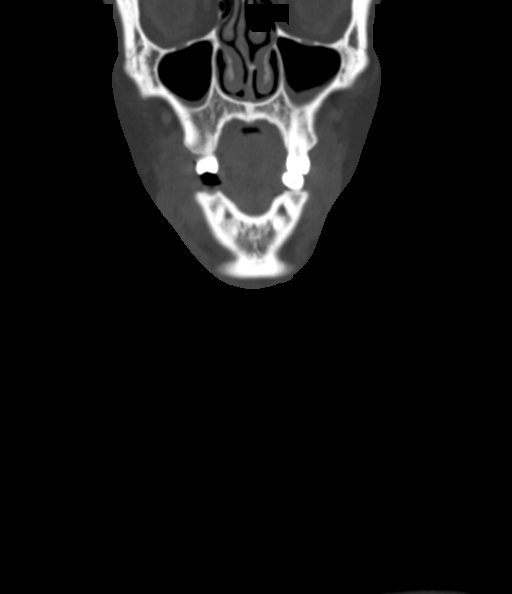
[im 52/130  bone]
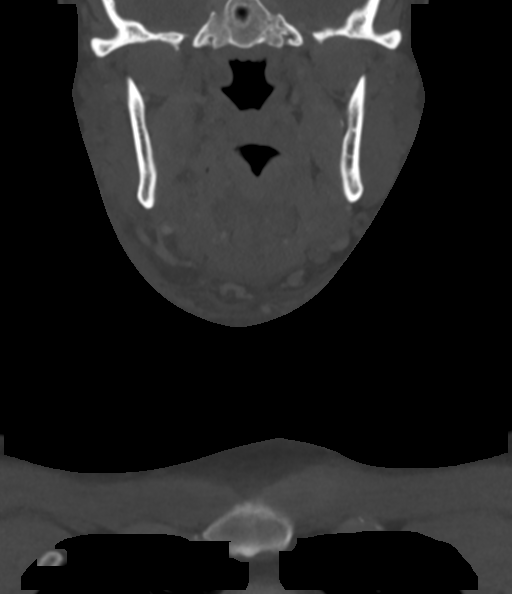
[im 78/130  bone]
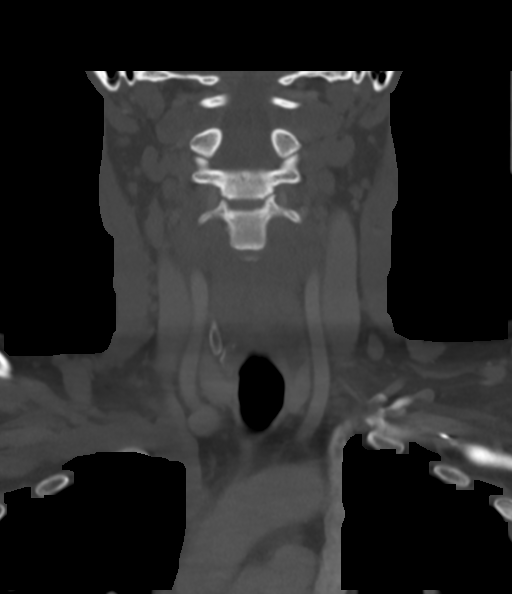

[Series 7: neck 2.0 st orthogonal · axial · 0.37mm/px · z∈[-205,-155]mm · 2 of 127 slices shown]
[im 26/127  bone]
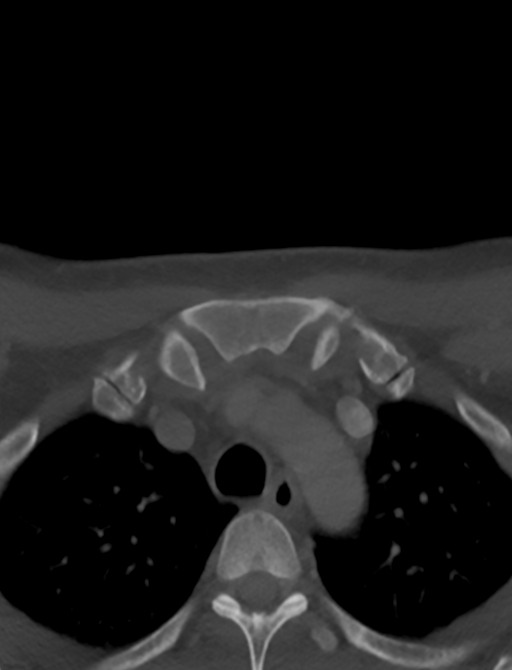
[im 51/127  bone]
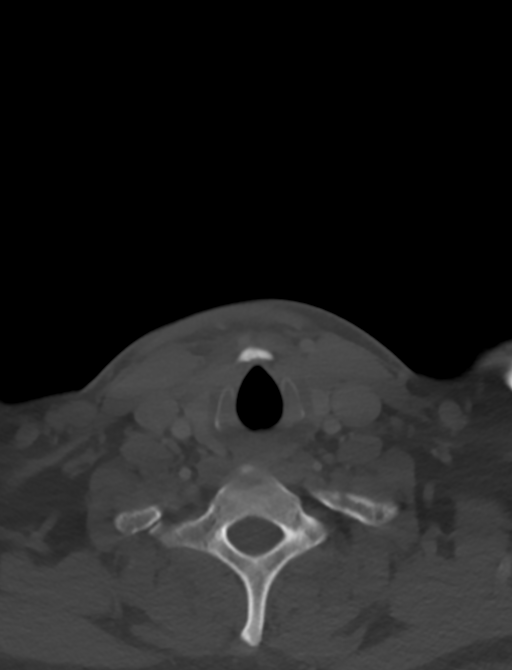

[14 of 33 positions shown; findings below may reference images not displayed]

FINDINGS: Pharynx and larynx: No focal mucosal or submucosal lesions are
present. Vocal cords are midline and symmetric. Trachea is within
normal limits.

Salivary glands: Inflammatory changes extend into the right
submandibular gland. There is some asymmetric edema within the right
gland. No intrinsic mass lesion is present. The parotid glands are
normal bilaterally.

Thyroid: Normal.

Lymph nodes: Asymmetric right level 2 and submandibular enlarged
lymph nodes are reactive.

Vascular: Negative.

Limited intracranial: Within normal limits.

Visualized orbits: Unremarkable.

Mastoids and visualized paranasal sinuses: Mild mucosal thickening
is present along the floor of the maxillary sinuses bilaterally. The
remaining visualized paranasal sinuses and mastoid air cells are
clear.

Skeleton: Mild endplate changes and uncovertebral spurring is
present C3-4, C4-5 C5-6, and C6-7. Definite stenosis is present.

Prominent dental caries are present in the second right mandibular
premolar tooth and the second right mandibular molar. Periapical
lucencies are present about both of these teeth. There is a cortical
disruption along the inferior and gingival surface of the mandible.
A 1.5 cm subperiosteal abscess is noted along the underside of the
right mandible. This extends to the angle of the mandible.

Upper chest: The lung apices are clear. The thoracic inlet is within
normal limits.

Other: There diffuse inflammatory changes within the right
submandibular space and thickening of the platysma the right.
Subcutaneous inflammatory changes are predominantly on the right but
do cross midline. No other soft tissue abscess is present.
IMPRESSION: 1. Large dental caries periapical lucency and associated
subperiosteal abscess involving the second right mandibular molar.
This is the center of inflammation associated with the right-sided
swelling.
2. Multiple enlarged reactive lymph nodes within the right
submandibular and level 2 stations.
3. Additional prominent dental caries and periapical lucency
involving the right second premolar tooth of the mandible.
4. No other soft tissue mass or abscess.

## 2019-01-16 ENCOUNTER — Emergency Department
Admission: EM | Admit: 2019-01-16 | Discharge: 2019-01-16 | Disposition: A | Discharge status: Home / Self Care | Payer: Self-pay | Attending: Emergency Medicine | Admitting: Emergency Medicine

## 2019-01-16 DIAGNOSIS — F17200 Nicotine dependence, unspecified, uncomplicated: Secondary | ICD-10-CM | POA: Insufficient documentation

## 2019-01-16 DIAGNOSIS — L0201 Cutaneous abscess of face: Secondary | ICD-10-CM | POA: Insufficient documentation

## 2019-01-16 MED ORDER — SULFAMETHOXAZOLE-TRIMETHOPRIM 800-160 MG PO TABS
1.0000 | ORAL_TABLET | Freq: Once | ORAL | Status: AC
Start: 2019-01-16 — End: 2019-01-16
  Administered 2019-01-16: 1 via ORAL
  Filled 2019-01-16: qty 1

## 2019-01-16 MED ORDER — CEPHALEXIN 250 MG PO CAPS
500.0000 mg | ORAL_CAPSULE | Freq: Once | ORAL | Status: AC
Start: 2019-01-16 — End: 2019-01-16
  Administered 2019-01-16: 500 mg via ORAL
  Filled 2019-01-16: qty 2

## 2019-01-16 MED ORDER — LIDOCAINE-EPINEPHRINE 1 %-1:100000 IJ SOLN
15.0000 mL | Freq: Once | INTRAMUSCULAR | Status: AC
Start: 2019-01-16 — End: 2019-01-16
  Administered 2019-01-16: 15 mL via INTRADERMAL
  Filled 2019-01-16: qty 20

## 2019-01-16 MED ORDER — CEPHALEXIN 500 MG PO CAPS
500.0000 mg | ORAL_CAPSULE | Freq: Three times a day (TID) | ORAL | 0 refills | Status: AC
Start: 2019-01-16 — End: 2019-01-23

## 2019-01-16 MED ORDER — SULFAMETHOXAZOLE-TRIMETHOPRIM 800-160 MG PO TABS
1.0000 | ORAL_TABLET | Freq: Two times a day (BID) | ORAL | 0 refills | Status: AC
Start: 2019-01-16 — End: 2019-01-23

## 2019-01-16 MED ORDER — IBUPROFEN 600 MG PO TABS
600.0000 mg | ORAL_TABLET | Freq: Four times a day (QID) | ORAL | 0 refills | Status: DC | PRN
Start: 2019-01-16 — End: 2022-10-06

## 2019-01-16 NOTE — ED Triage Notes (Signed)
Pt c/o of abscess overt his left eye

## 2019-01-16 NOTE — ED Notes (Signed)
Bed: E01  Expected date:   Expected time:   Means of arrival:   Comments:

## 2019-01-16 NOTE — ED Provider Notes (Signed)
EMERGENCY DEPARTMENT NOTE    Physician/Midlevel provider first contact with patient: 01/16/19 1126         HISTORY OF PRESENT ILLNESS   Historian: patient  Translator Used: no    38 y.o. male presents with abscess involving his left eyebrow.    1. Location of symptoms: see above  2. Onset of symptoms: 1 wk ago  3. What was patient doing when symptoms started (Context): hx of abscess along his hair line and face  4. Severity: mild unless he messes with it  5. Timing: gradual onset,  6. Activities that worsen symptoms: touching abscess  7. Activities that improve symptoms: not touching abscess  8. Quality: red, warm, tender mass   9. Radiation of symptoms: none  10. Associated signs and Symptoms: no change in vision, no fever   11. Are symptoms worsening? yes      MEDICAL HISTORY     Past Medical History:  Past Medical History:   Diagnosis Date    Gout        Past Surgical History:  Past Surgical History:   Procedure Laterality Date    CYSTOSCOPY, URETEROSCOPY, LASER LITHOTRIPSY Left 11/29/2015    Procedure: CYSTOSCOPY, URETEROSCOPY, RETROGRADE PYELOGRAM  AND INSERTION OF LEFT DOUBLE  J STENT . ;  Surgeon: Prescilla Sours, MD;  Location: [facility];  Service: Urology;  Laterality: Left;  **[id number]/MD AVAIL 1600**       Social History:  Social History     Socioeconomic History    Marital status: Single     Spouse name: Not on file    Number of children: Not on file    Years of education: Not on file    Highest education level: Not on file   Occupational History    Not on file   Social Needs    Financial resource strain: Not on file    Food insecurity     Worry: Not on file     Inability: Not on file    Transportation needs     Medical: Not on file     Non-medical: Not on file   Tobacco Use    Smoking status: Current Every Day Smoker    Smokeless tobacco: Never Used   Substance and Sexual Activity    Alcohol use: Yes     Comment: social    Drug use: No    Sexual activity: Not on file   Lifestyle     Physical activity     Days per week: Not on file     Minutes per session: Not on file    Stress: Not on file   Relationships    Social connections     Talks on phone: Not on file     Gets together: Not on file     Attends religious service: Not on file     Active member of club or organization: Not on file     Attends meetings of clubs or organizations: Not on file     Relationship status: Not on file    Intimate partner violence     Fear of current or ex partner: Not on file     Emotionally abused: Not on file     Physically abused: Not on file     Forced sexual activity: Not on file   Other Topics Concern    Not on file   Social History Narrative    Not on file  Family History:  History reviewed. No pertinent family history.    Outpatient Medication:  Discharge Medication List as of 01/16/2019 12:05 PM          Allergies:  No Known Allergies    REVIEW OF SYSTEMS   Review of Systems   HENT:        Swelling, pain above left eye   All other systems reviewed and are negative.          PHYSICAL EXAM     Vitals:    01/16/19 1225   BP: 141/83   Pulse: 67   Resp: 18   Temp: 98.3 F (36.8 C)   SpO2: 97%       Nursing note and vitals reviewed.    Constitutional: non-toxic  Head: Atraumatic.  Eyes: PERRL. EOMI. No scleral icterus.  ENT: Mucous membranes are moist and intact. Oropharynx is clear. Patent airway. 1.5 cm erythematous, fluctant, warm, tender mass along left eyebrow  Neck: Supple.   Pulmonary/Chest: No evidence of respiratory distress.   GI: Soft, non-distended abdomen.  Extremities: No edema. No deformity.  Skin: No rash.   Neurological: Awake, alert and oriented x 3. CN II-XII intact. Strength intact.   Psychiatric: Appropriate affect. Appropriate mood. Appropriate behavior.    MEDICAL DECISION MAKING   Incision/Drainage  Date/Time: 01/16/2019 2:40 PM  Performed by: Marland Mcalpine, MD  Authorized by: Marland Mcalpine, MD     Consent:     Consent obtained:  Verbal    Consent given  by:  Patient    Risks discussed:  Bleeding, incomplete drainage, pain, damage to other organs and infection    Alternatives discussed:  No treatment  Location:     Type:  Abscess    Size:  1.5 cm    Location: left eyebrow.  Pre-procedure details:     Skin preparation:  Chloraprep  Anesthesia (see MAR for exact dosages):     Anesthesia method:  Local infiltration    Local anesthetic:  Lidocaine 1% WITH epi  Procedure type:     Complexity:  Simple  Procedure details:     Needle aspiration: yes      Needle size:  18 G    Drainage:  Purulent    Drainage amount:  Moderate    Wound treatment:  Wound left open    Packing materials:  None  Post-procedure details:     Patient tolerance of procedure:  Tolerated well, no immediate complications  Comments:      Aspiration with 18 gauge needle, 1 cc of pus, lesion flattened out, only blood oozing from site after evacuation, bandage placed, discussed compresses every 2 hours for next week, bactrim, keflex        DISCUSSION      Vital Signs: Reviewed the patient?s vital signs.   Nursing Notes: Reviewed and utilized available nursing notes.  Medical Records Reviewed: Reviewed available past medical records.  Counseling: The emergency provider has spoken with the patient and discussed today?s findings, in addition to providing specific details for the plan of care.  Questions are answered and there is agreement with the plan.    IMAGING STUDIES    The following imaging studies were reviewed by the Emergency Medicine Physician.  For full imaging study results please see chart.    No orders to display       CARDIAC STUDIES     The following cardiac studies were independently interpreted by the Emergency Medicine Physician. For full cardiac study  results please see chart     PULSE OXIMETRY        EMERGENCY DEPT. MEDICATIONS      ED Medication Orders (From admission, onward)    Start Ordered     Status Ordering Provider    01/16/19 1205 01/16/19 1204  sulfamethoxazole-trimethoprim  (BACTRIM DS) 800-160 MG per tablet 1 tablet  Once     Route: Oral  Ordered Dose: 1 tablet     Last MAR action:  Given Ladoris Gene [facility]    01/16/19 1205 01/16/19 1204  cephALEXin (KEFLEX) capsule 500 mg  Once     Route: Oral  Ordered Dose: 500 mg     Last MAR action:  Given Ladoris Gene [facility]    01/16/19 1146 01/16/19 1145  lidocaine-EPINEPHrine (XYLOCAINE W/EPI) 1 %-1:100000 injection 15 mL  Once     Route: Intradermal  Ordered Dose: 15 mL     Last MAR action:  Given by Other Marland Mcalpine          LABORATORY RESULTS    Ordered and independently interpreted AVAILABLE laboratory tests. Please see results section in chart for full details.      ATTESTATIONS      Physician Attestation: Darlyn Read MD, have been the primary provider for Marge Duncans during this Emergency Dept visit and have reviewed the chart for accuracy and agree with its content.     DIAGNOSIS      Diagnosis:  Final diagnoses:   Abscess, eyebrow       Disposition:  ED Disposition     ED Disposition Condition Date/Time Comment    Discharge  Mon Jan 16, 2019 12:05 PM Marge Duncans discharge to home/self care.    Condition at disposition: Stable          Prescriptions:  Discharge Medication List as of 01/16/2019 12:05 PM      START taking these medications    Details   cephalexin (KEFLEX) 500 MG capsule Take 1 capsule (500 mg total) by mouth 3 (three) times daily for 7 days, Starting Mon 01/16/2019, Until Mon 01/23/2019, Print      ibuprofen (ADVIL) 600 MG tablet Take 1 tablet (600 mg total) by mouth every 6 (six) hours as needed for Pain, Starting Mon 01/16/2019, Print      sulfamethoxazole-trimethoprim (BACTRIM DS) 800-160 MG per tablet Take 1 tablet by mouth 2 (two) times daily for 7 days, Starting Mon 01/16/2019, Until Mon 01/23/2019, Print                Marland Mcalpine, MD  01/16/19 1442

## 2019-11-09 ENCOUNTER — Emergency Department: Payer: Self-pay

## 2019-11-09 ENCOUNTER — Inpatient Hospital Stay
Admission: EM | Admit: 2019-11-09 | Discharge: 2019-11-10 | DRG: 917 | Disposition: A | Discharge status: Home / Self Care | Payer: Self-pay | Attending: Internal Medicine | Admitting: Internal Medicine

## 2019-11-09 DIAGNOSIS — J9601 Acute respiratory failure with hypoxia: Secondary | ICD-10-CM | POA: Diagnosis present

## 2019-11-09 DIAGNOSIS — T405X1A Poisoning by cocaine, accidental (unintentional), initial encounter: Principal | ICD-10-CM | POA: Diagnosis present

## 2019-11-09 DIAGNOSIS — F1010 Alcohol abuse, uncomplicated: Secondary | ICD-10-CM | POA: Diagnosis present

## 2019-11-09 DIAGNOSIS — F1990 Other psychoactive substance use, unspecified, uncomplicated: Secondary | ICD-10-CM | POA: Diagnosis present

## 2019-11-09 DIAGNOSIS — E876 Hypokalemia: Secondary | ICD-10-CM | POA: Diagnosis present

## 2019-11-09 DIAGNOSIS — Z599 Problem related to housing and economic circumstances, unspecified: Secondary | ICD-10-CM

## 2019-11-09 DIAGNOSIS — T40601A Poisoning by unspecified narcotics, accidental (unintentional), initial encounter: Secondary | ICD-10-CM | POA: Insufficient documentation

## 2019-11-09 DIAGNOSIS — R0902 Hypoxemia: Secondary | ICD-10-CM | POA: Diagnosis present

## 2019-11-09 DIAGNOSIS — F1410 Cocaine abuse, uncomplicated: Secondary | ICD-10-CM | POA: Diagnosis present

## 2019-11-09 DIAGNOSIS — M109 Gout, unspecified: Secondary | ICD-10-CM | POA: Diagnosis present

## 2019-11-09 DIAGNOSIS — T510X1A Toxic effect of ethanol, accidental (unintentional), initial encounter: Secondary | ICD-10-CM | POA: Diagnosis present

## 2019-11-09 DIAGNOSIS — I469 Cardiac arrest, cause unspecified: Secondary | ICD-10-CM | POA: Diagnosis present

## 2019-11-09 DIAGNOSIS — Z609 Problem related to social environment, unspecified: Secondary | ICD-10-CM | POA: Diagnosis present

## 2019-11-09 DIAGNOSIS — D72829 Elevated white blood cell count, unspecified: Secondary | ICD-10-CM

## 2019-11-09 DIAGNOSIS — Z87442 Personal history of urinary calculi: Secondary | ICD-10-CM

## 2019-11-09 DIAGNOSIS — F17200 Nicotine dependence, unspecified, uncomplicated: Secondary | ICD-10-CM | POA: Diagnosis present

## 2019-11-09 DIAGNOSIS — R55 Syncope and collapse: Secondary | ICD-10-CM

## 2019-11-09 LAB — CBC AND DIFFERENTIAL
Absolute NRBC: 0 10*3/uL (ref 0.00–0.00)
Absolute NRBC: 0 10*3/uL (ref 0.00–0.00)
Basophils Absolute Automated: 0.08 10*3/uL (ref 0.00–0.08)
Basophils Absolute Automated: 0.06 10*3/uL (ref 0.00–0.08)
Basophils Automated: 0.6 %
Basophils Automated: 0.6 %
Eosinophils Absolute Automated: 0.17 10*3/uL (ref 0.00–0.44)
Eosinophils Absolute Automated: 0.07 10*3/uL (ref 0.00–0.44)
Eosinophils Automated: 1.3 %
Eosinophils Automated: 0.7 %
Hematocrit: 49.7 % — ABNORMAL HIGH (ref 37.6–49.6)
Hematocrit: 48.1 % (ref 37.6–49.6)
Hgb: 16.9 g/dL (ref 12.5–17.1)
Hgb: 16 g/dL (ref 12.5–17.1)
Immature Granulocytes Absolute: 0.09 10*3/uL — ABNORMAL HIGH (ref 0.00–0.07)
Immature Granulocytes Absolute: 0.04 10*3/uL (ref 0.00–0.07)
Immature Granulocytes: 0.7 %
Immature Granulocytes: 0.4 %
Lymphocytes Absolute Automated: 6.33 10*3/uL — ABNORMAL HIGH (ref 0.42–3.22)
Lymphocytes Absolute Automated: 2.78 10*3/uL (ref 0.42–3.22)
Lymphocytes Automated: 47.5 %
Lymphocytes Automated: 25.8 %
MCH: 32.7 pg (ref 25.1–33.5)
MCH: 31.7 pg (ref 25.1–33.5)
MCHC: 34 g/dL (ref 31.5–35.8)
MCHC: 33.3 g/dL (ref 31.5–35.8)
MCV: 96.1 fL — ABNORMAL HIGH (ref 78.0–96.0)
MCV: 95.2 fL (ref 78.0–96.0)
MPV: 8.7 fL — ABNORMAL LOW (ref 8.9–12.5)
MPV: 8.8 fL — ABNORMAL LOW (ref 8.9–12.5)
Monocytes Absolute Automated: 0.98 10*3/uL — ABNORMAL HIGH (ref 0.21–0.85)
Monocytes Absolute Automated: 0.93 10*3/uL — ABNORMAL HIGH (ref 0.21–0.85)
Monocytes: 7.4 %
Monocytes: 8.6 %
Neutrophils Absolute: 5.68 10*3/uL (ref 1.10–6.33)
Neutrophils Absolute: 6.88 10*3/uL — ABNORMAL HIGH (ref 1.10–6.33)
Neutrophils: 42.5 %
Neutrophils: 63.9 %
Nucleated RBC: 0 /100 WBC (ref 0.0–0.0)
Nucleated RBC: 0 /100 WBC (ref 0.0–0.0)
Platelets: 231 10*3/uL (ref 142–346)
Platelets: 202 10*3/uL (ref 142–346)
RBC: 5.17 10*6/uL (ref 4.20–5.90)
RBC: 5.05 10*6/uL (ref 4.20–5.90)
RDW: 15 % (ref 11–15)
RDW: 15 % (ref 11–15)
WBC: 13.33 10*3/uL — ABNORMAL HIGH (ref 3.10–9.50)
WBC: 10.76 10*3/uL — ABNORMAL HIGH (ref 3.10–9.50)

## 2019-11-09 LAB — URINALYSIS REFLEX TO MICROSCOPIC EXAM - REFLEX TO CULTURE
Bilirubin, UA: NEGATIVE
Blood, UA: NEGATIVE
Glucose, UA: NEGATIVE
Ketones UA: NEGATIVE
Leukocyte Esterase, UA: NEGATIVE
Nitrite, UA: NEGATIVE
Protein, UR: 30 — AB
Specific Gravity UA: 1.012 (ref 1.001–1.035)
Urine pH: 6 (ref 5.0–8.0)
Urobilinogen, UA: NEGATIVE mg/dL (ref 0.2–2.0)

## 2019-11-09 LAB — RAPID DRUG SCREEN, URINE
Barbiturate Screen, UR: NEGATIVE
Benzodiazepine Screen, UR: NEGATIVE
Cannabinoid Screen, UR: NEGATIVE
Cocaine, UR: POSITIVE — AB
Opiate Screen, UR: NEGATIVE
PCP Screen, UR: NEGATIVE
Urine Amphetamine Screen: NEGATIVE

## 2019-11-09 LAB — BASIC METABOLIC PANEL
Anion Gap: 18 — ABNORMAL HIGH (ref 5.0–15.0)
Anion Gap: 13 (ref 5.0–15.0)
BUN: 5 mg/dL — ABNORMAL LOW (ref 9–28)
BUN: 4 mg/dL — ABNORMAL LOW (ref 9–28)
CO2: 22 mEq/L (ref 22–29)
CO2: 22 mEq/L (ref 22–29)
Calcium: 8.7 mg/dL (ref 8.5–10.5)
Calcium: 8.2 mg/dL — ABNORMAL LOW (ref 8.5–10.5)
Chloride: 104 mEq/L (ref 100–111)
Chloride: 109 mEq/L (ref 100–111)
Creatinine: 1.1 mg/dL (ref 0.7–1.3)
Creatinine: 0.8 mg/dL (ref 0.7–1.3)
Glucose: 169 mg/dL — ABNORMAL HIGH (ref 70–100)
Glucose: 67 mg/dL — ABNORMAL LOW (ref 70–100)
Potassium: 3.2 mEq/L — ABNORMAL LOW (ref 3.5–5.1)
Potassium: 4.1 mEq/L (ref 3.5–5.1)
Sodium: 144 mEq/L (ref 136–145)
Sodium: 144 mEq/L (ref 136–145)

## 2019-11-09 LAB — COVID-19 (SARS-COV-2): SARS CoV 2 Overall Result: NEGATIVE

## 2019-11-09 LAB — HEPATIC FUNCTION PANEL
ALT: 32 U/L (ref 0–55)
AST (SGOT): 41 U/L — ABNORMAL HIGH (ref 5–34)
Albumin/Globulin Ratio: 1.2 (ref 0.9–2.2)
Albumin: 3.8 g/dL (ref 3.5–5.0)
Alkaline Phosphatase: 57 U/L (ref 38–106)
Bilirubin Direct: 0.2 mg/dL (ref 0.0–0.5)
Bilirubin Indirect: 0.5 mg/dL (ref 0.2–1.0)
Bilirubin, Total: 0.7 mg/dL (ref 0.2–1.2)
Globulin: 3.2 g/dL (ref 2.0–3.6)
Protein, Total: 7 g/dL (ref 6.0–8.3)

## 2019-11-09 LAB — PHOSPHORUS: Phosphorus: 3.4 mg/dL (ref 2.3–4.7)

## 2019-11-09 LAB — GFR
EGFR: >60
EGFR: >60

## 2019-11-09 LAB — TROPONIN I
Troponin I: <0.01 ng/mL (ref 0.00–0.05)
Troponin I: <0.01 ng/mL (ref 0.00–0.05)

## 2019-11-09 LAB — GLUCOSE WHOLE BLOOD - POCT: Whole Blood Glucose POCT: 84 mg/dL (ref 70–100)

## 2019-11-09 LAB — MAGNESIUM: Magnesium: 2.1 mg/dL (ref 1.6–2.6)

## 2019-11-09 MED ORDER — SODIUM CHLORIDE 0.9 % IV BOLUS
1000.00 mL | Freq: Once | INTRAVENOUS | Status: AC
Start: 2019-11-09 — End: 2019-11-09
  Administered 2019-11-09: 04:00:00 1000 mL via INTRAVENOUS

## 2019-11-09 MED ORDER — NALOXONE HCL 0.4 MG/ML IJ SOLN (WRAP)
0.20 mg | INTRAMUSCULAR | Status: DC | PRN
Start: 2019-11-09 — End: 2019-11-10

## 2019-11-09 MED ORDER — ONDANSETRON 4 MG PO TBDP
4.00 mg | ORAL_TABLET | Freq: Four times a day (QID) | ORAL | Status: DC | PRN
Start: 2019-11-09 — End: 2019-11-10

## 2019-11-09 MED ORDER — DEXTROSE 50 % IV SOLN
12.50 g | INTRAVENOUS | Status: DC | PRN
Start: 2019-11-09 — End: 2019-11-10

## 2019-11-09 MED ORDER — SODIUM CHLORIDE (PF) 0.9 % IJ SOLN
3.00 mL | Freq: Three times a day (TID) | INTRAMUSCULAR | Status: DC
Start: 2019-11-09 — End: 2019-11-10
  Administered 2019-11-09 – 2019-11-10 (×3): 3 mL via INTRAVENOUS
  Filled 2019-11-09 (×5): qty 10

## 2019-11-09 MED ORDER — POTASSIUM CHLORIDE CRYS ER 20 MEQ PO TBCR
40.00 meq | EXTENDED_RELEASE_TABLET | Freq: Once | ORAL | Status: AC
Start: 2019-11-09 — End: 2019-11-09
  Administered 2019-11-09: 06:00:00 40 meq via ORAL
  Filled 2019-11-09: qty 2

## 2019-11-09 MED ORDER — ACETAMINOPHEN 325 MG PO TABS
650.0000 mg | ORAL_TABLET | Freq: Four times a day (QID) | ORAL | Status: DC | PRN
Start: 2019-11-09 — End: 2019-11-10
  Administered 2019-11-10: 650 mg via ORAL
  Filled 2019-11-09: qty 2

## 2019-11-09 MED ORDER — ACETAMINOPHEN 650 MG RE SUPP
650.00 mg | Freq: Four times a day (QID) | RECTAL | Status: DC | PRN
Start: 2019-11-09 — End: 2019-11-10

## 2019-11-09 MED ORDER — ONDANSETRON HCL 4 MG/2ML IJ SOLN
8.00 mg | Freq: Once | INTRAMUSCULAR | Status: AC
Start: 2019-11-09 — End: 2019-11-09
  Administered 2019-11-09: 02:00:00 8 mg via INTRAVENOUS
  Filled 2019-11-09: qty 4

## 2019-11-09 MED ORDER — ENOXAPARIN SODIUM 40 MG/0.4ML SC SOLN
40.00 mg | Freq: Every day | SUBCUTANEOUS | Status: DC
Start: 2019-11-09 — End: 2019-11-10
  Administered 2019-11-09 – 2019-11-10 (×2): 40 mg via SUBCUTANEOUS
  Filled 2019-11-09 (×2): qty 0.4

## 2019-11-09 MED ORDER — GLUCAGON 1 MG IJ SOLR (WRAP)
1.00 mg | INTRAMUSCULAR | Status: DC | PRN
Start: 2019-11-09 — End: 2019-11-10

## 2019-11-09 MED ORDER — GLUCOSE 40 % PO GEL
15.00 g | ORAL | Status: DC | PRN
Start: 2019-11-09 — End: 2019-11-10

## 2019-11-09 MED ORDER — MELATONIN 3 MG PO TABS
3.0000 mg | ORAL_TABLET | Freq: Every evening | ORAL | Status: DC | PRN
Start: 2019-11-09 — End: 2019-11-10
  Filled 2019-11-09: qty 1

## 2019-11-09 MED ORDER — ONDANSETRON HCL 4 MG/2ML IJ SOLN
4.00 mg | Freq: Four times a day (QID) | INTRAMUSCULAR | Status: DC | PRN
Start: 2019-11-09 — End: 2019-11-10
  Administered 2019-11-09: 09:00:00 4 mg via INTRAVENOUS
  Filled 2019-11-09: qty 2

## 2019-11-09 MED ORDER — SODIUM CHLORIDE 0.9 % IV BOLUS
1000.00 mL | Freq: Once | INTRAVENOUS | Status: AC
Start: 2019-11-09 — End: 2019-11-09
  Administered 2019-11-09: 02:00:00 1000 mL via INTRAVENOUS

## 2019-11-09 NOTE — ED Notes (Signed)
[facility] EMERGENCY DEPARTMENT  ED NURSING NOTE FOR THE RECEIVING INPATIENT NURSE   ED NURSE Patrici Ranks [phone]   ED CHARGE RN Mardella Layman   ADMISSION INFORMATION   [name] [name] is a 39 y.o. male admitted with an ED diagnosis of:    1. Opiate overdose, accidental or unintentional, initial encounter    2. Hypoxia         Isolation: None   Allergies: Patient has no known allergies.   Holding Orders confirmed? Yes   Belongings Documented? Yes   Home medications sent to pharmacy confirmed? N/A   NURSING CARE   Patient Comes From:   Mental Status: Home Independent  oriented   ADL: Needs assistance with ADLs   Ambulation: Unable to assess   Pertinent Information  and Safety Concerns: Pt is still lethargic but oriented. Did not witness patient ambulate, but appears to be self-care when not intoxicated.       CT / NIH   CT Head ordered on this patient?  No   NIH/Dysphagia assessment done prior to admission? N/A   VITAL SIGNS (at the time of this note)      Vitals:    11/09/19 0530   BP: 104/58   Pulse: 74   Resp: 17   Temp: 97.9 F (36.6 C)   SpO2: 95%

## 2019-11-09 NOTE — Progress Notes (Signed)
MEDICINE PROGRESS NOTE  [facility], DIVISION OF HOSPITALIST MEDICINE   [facility]   [place] Pager: [phone]      Date Time: 11/09/19 3:03 PM  Patient Name: [name] [name],[name] [name]  Attending Physician: Ilda Foil, MD  Hospital Day: 1  Assessment:     Active Hospital Problems    Diagnosis    Hypoxia    Cardiac arrest    Illicit drug use     39 year old male with history of gout, nephrolithiasis who presented to the hospital with cardiac arrest receiving 1 round of CPR per EMS.    Plan:   Cardiac arrest--possibly in setting of illicit drug use/cocaine but unclear as patient states that he use illicit drugs almost 24 hours prior to episode; patient denies any heroin use  -Check echocardiogram  -Follow on telemetry  -Will have cardiology consult additionally, discussed with Dr. Melina Schools  -Check urine toxicology screen    Illicit drug use--patient reports using cocaine on 6/15  -Patient counseled at length on risks of illicit drug use including heart attack, stroke, and even death; patient verbalized understanding  -Check urine toxicology screen    Leukocytosis--improved and suspect reactive in the setting of cardiac arrest  -Continue off of antibiotics    Depression--patient denies any suicidal ideation--denies any thoughts of hurting or killing himself or others; but does report significant depression  -Will have psychiatry consult, discussed with Dr. Barnet Glasgow    Hypoxia--uncertain exact etiology but suspect in setting of atelectasis, less likely aspiration pneumonitis  -Wean oxygen as possible following oxygen saturation levels    POC discussed with pt, all questions answered  Discussed with mother at bedside at pt's request  Nurse at bedside    Safety Checklist:     DVT prophylaxis:  CHEST guideline (See page e199S) Chemical   Foley:  [place] Rn Foley protocol Not present   IVs:  Peripheral IV   PT/OT: Not needed     Lines:     Patient Lines/Drains/Airways Status    Active PICC Line / CVC Line / PIV  Line / Drain / Airway / Intraosseous Line / Epidural Line / ART Line / Line / Wound / Pressure Ulcer / NG/OG Tube     Name:   Placement date:   Placement time:   Site:   Days:    Peripheral IV 11/09/19 18 G Left Antecubital   11/09/19    --    Antecubital   less than 1                 Disposition:     Today's date: 11/09/2019   Length of Stay: 0  Anticipated medical stability for discharge: possibly 1day  Reason for ongoing hospitalization: eval of cardiac arrest    Subjective     CC: Opiate overdose, accidental or unintentional, initial encounter  HPI/Subjective: Patient seen and examined.  Patient currently denies any chest pain or shortness of breath.  Patient reports he used cocaine on evening of Tuesday, 6/15.  Patient states he went to work on 6/16 and denies any illicit drug use on 6/16.  Patient does report alcohol use after work on 6/16.  Patient uncertain of events subsequently that led to his cardiac arrest but again denies any illicit drug use on 6/16.    Review of Systems:   Review of Systems - Negative except as above in HPI    Physical Exam:     Temp:  [97.7 F (36.5 C)-97.9 F (36.6 C)] 97.7  F (36.5 C)  Heart Rate:  [68-105] 73  Resp Rate:  [14-20] 18  BP: (97-134)/(56-90) 123/77    Intake/Output Summary (Last 24 hours) at 11/09/2019 1503  Last data filed at 11/09/2019 0324  Gross per 24 hour   Intake 1000 ml   Output --   Net 1000 ml    General: alert and awake, no distress  HEENT: anicteric, moist mucous membranes  Neck: supple, no JVD  CVS: nl S1, S2; no gallop or rub  Lungs: clear to auscultation b/l, no wheezing or rhonchi; normal resp effort  Abd: soft, non-tender, non-distended, positive bowel sounds  Ext: no pedal edema, no cyanosis  Neuro: alert and awake, moving all extremities with no focal deficits       Meds:   Medications were reviewed:  Scheduled Meds:  Current Facility-Administered Medications   Medication Dose Route Frequency    enoxaparin  40 mg Subcutaneous Daily    sodium  chloride (PF)  3 mL Intravenous Q8H     Continuous Infusions:  PRN Meds:.acetaminophen **OR** acetaminophen, Nursing communication: Adult Hypoglycemia Treatment Algorithm **AND** dextrose **AND** dextrose **AND** glucagon (rDNA), melatonin, naloxone, ondansetron **OR** ondansetron  Labs/Radiology:   Imaging personally reviewed, including: all available   XR Chest  AP Portable    Result Date: 11/09/2019   1. Mild left basilar atelectasis. Otherwise no acute changes. Ida Rogue, MD  11/09/2019 2:17 AM    No results for input(s): GLUCOSEWB in the last 24 hours.  Recent Labs   Lab 11/09/19  1135 11/09/19  0105   Sodium 144 144   Potassium 4.1 3.2*   Chloride 109 104   BUN 4* 5*   Creatinine 0.8 1.1   EGFR >60.0 >60.0   Glucose 67* 169*   Calcium 8.2* 8.7     Recent Labs   Lab 11/09/19  1135 11/09/19  0105   WBC 10.76* 13.33*   Hgb 16.0 16.9   Hematocrit 48.1 49.7*   Platelets 202 231         Recent Labs   Lab 11/09/19  0105   Alkaline Phosphatase 57   Bilirubin, Total 0.7   Bilirubin Direct 0.2   ALT 32   AST (SGOT) 41*       This note was generated by the Epic EMR system/ Dragon speech recognition and may contain inherent errors or omissions not intended by the user. Grammatical errors, random word insertions, deletions and pronoun errors  are occasional consequences of this technology due to software limitations. Not all errors are caught or corrected. If there are questions or concerns about the content of this note or information contained within the body of this dictation they should be addressed directly with the author for clarification.    Signed by: Ilda Foil, MD

## 2019-11-09 NOTE — H&P (Signed)
ADMISSION HISTORY AND PHYSICAL EXAM    [facility], DIVISION OF HOSPITALIST MEDICINE   [facility]   [place] Pager: [phone]      Date Time: 11/09/19 5:35 AM  Patient Name: [name] [name],[name] [name]  Attending Physician: Lance Coon, MD  Primary Care Physician: Pcp, None, MD    CC: out of the hospital cardiac arrest    Assessment:     Active Hospital Problems    Diagnosis    Opiate overdose, accidental or unintentional, initial encounter    Hypoxia     39 year old male medical history of nephrolithiasis status post cystoscopy in 2017, gout presented to the ER due to the outside of the hospital cardiac arrest.  Unknown total downtime, reported 1 round of CPR per EMS.  Alert oriented awake upon arrival to the ER.  Noted to be hypoxic requiring 3 L of nasal cannula oxygen.  Admitted for the further evaluation of hypoxia.  Plan:   -Admit to [employer] service    #Out of the hospital cardiac arrest  -unknown total downtime  -per the ER note, patient found unresponsive by police, no pulse, given 8 mg of narcan intranasal. CPR started by PD. EMS called, when EMS arrived, pt agonal respirations, continued CPR for 1 round, pulse returned and patient became awake. Upon arrival to the ER, pt was awake and talking.  -monitor neurological status    #Acute hypoxic respiratory failure  -hypoxic to upper 80's off oxygen.  -likely from mild left basilar atelectasis seen on CXR  -aggressive pulmonary hygiene.  -Continue supplemental oxygen for O2 saturation goal of greater than 92%  -wean as tolerated  -incentive spirometry    #Leukocytosis  #Acute hypokalemia  #Opiate overdose  #Hx nephrolithiasis s/p cystoscopy in 11/2015    #DVT ppx-Lovenox 40mg  qDaily & SCDs  #Code status-Full  #Diet-Regular    Lance Coon, MD  Nocturnist from 7P to 7A.  Pager# [phone]   Spectralink# [phone]    Disposition:     Anticipated medical stability for discharge: 24-72hrs  Service status: Inpatient: risk of morbidity and mortality    History of  Presenting Illness:   [name] [name] is a 39 y.o. male with past medical history of gout and nephrolithiasis status post cystoscopy in 2017.  Patient was found down at the 711.  Police was called upon arrival patient was found to be pulseless CPR was initiated by the police EMS arrived patient was found to be in agonal respiration and 1 more round of CPR was performed upon arrival to the ER patient was talking and alert oriented.  However patient was hypoxic to 89% at rest and required 3 L of nasal cannula in order to appropriately maintain oxygen saturation.  His saturation still drops to 89% on room air when the ER staff was trying to wean.  And because of this borderline hypoxia the patient was admitted to the hospitalist medicine service for the further evaluation and treatment.    Past Medical History:     Past Medical History:   Diagnosis Date    Gout      Available old records reviewed, including: EPIC     Past Surgical History:     Past Surgical History:   Procedure Laterality Date    CYSTOSCOPY, URETEROSCOPY, LASER LITHOTRIPSY Left 11/29/2015    Procedure: CYSTOSCOPY, URETEROSCOPY, RETROGRADE PYELOGRAM  AND INSERTION OF LEFT DOUBLE  J STENT . ;  Surgeon: Prescilla Sours, MD;  Location: [facility];  Service: Urology;  Laterality: Left;  **[id number]/MD AVAIL 1600**       Family History:   History reviewed. No pertinent family history.    Social History:    reports that he has been smoking. He has never used smokeless tobacco. He reports current alcohol use. He reports that he does not use drugs.    Allergies:   No Known Allergies    Medications:     Home Medications             ibuprofen (ADVIL) 600 MG tablet     Take 1 tablet (600 mg total) by mouth every 6 (six) hours as needed for Pain          Review of Systems:   All other systems were reviewed and are negative except:as above in HPI     Physical Exam:     Patient Vitals for the past 24 hrs:   BP Temp Pulse Resp SpO2 Weight   11/09/19 0330 101/56  85  16 95 %    11/09/19 0201 116/74  93 20 94 %    11/09/19 0111   98  94 %    11/09/19 0101 134/90 97.8 F (36.6 C) (!) 105 20 (!) 89 % 88.5 kg (195 lb 1.7 oz)     Body mass index is 27.21 kg/m.    Intake/Output Summary (Last 24 hours) at 11/09/2019 0535  Last data filed at 11/09/2019 0324  Gross per 24 hour   Intake 1000 ml   Output    Net 1000 ml       General: awake; no acute distress.  HEENT: perrla, eomi, sclera anicteric  oropharynx clear without lesions, mucous membranes moist  Neck: supple, no lymphadenopathy, no JVD, no carotid bruits  Cardiovascular: Normal S1 and S2, no murmurs, rubs or gallops  Lungs: clear to auscultation bilaterally, without wheezing, rhonchi, or rales  Abdomen: soft, non-tender, non-distended; no palpable masses, no hepatosplenomegaly, normoactive bowel sounds, no rebound or guarding  Extremities: no clubbing, cyanosis, or edema  Neuro: alert, oriented x 3, cranial nerves grossly intact, strength 5/5 in upper and lower extremities, sensation intact,   Skin: no rashes or lesions noted    Labs:     Results     Procedure Component Value Units Date/Time    COVID-19 (SARS-COV-2) ([facility]) [[id number]] Collected: 11/09/19 0525    Specimen: Nasopharyngeal Swab from Nasopharynx Updated: 11/09/19 0531     Purpose of COVID testing Screening     SARS-CoV-2 Specimen Source Nasopharyngeal    Narrative:      Screening    Troponin I [[id number]] Collected: 11/09/19 0105    Specimen: Blood Updated: 11/09/19 0150     Troponin I <0.01 ng/mL     Magnesium [[id number]] Collected: 11/09/19 0105    Specimen: Blood Updated: 11/09/19 0141     Magnesium 2.1 mg/dL     GFR [[id number]] Collected: 11/09/19 0105     Updated: 11/09/19 0141     EGFR >60.0    Basic Metabolic Panel [[id number]]  (Abnormal) Collected: 11/09/19 0105    Specimen: Blood Updated: 11/09/19 0141     Glucose 169 mg/dL      BUN 5 mg/dL      Creatinine 1.1 mg/dL      Calcium 8.7 mg/dL      Sodium 595 mEq/L      Potassium 3.2 mEq/L       Chloride 104 mEq/L      CO2 22 mEq/L      Anion  Gap 18.0    Hepatic function panel (LFT) [[id number]]  (Abnormal) Collected: 11/09/19 0105    Specimen: Blood Updated: 11/09/19 0141     Bilirubin, Total 0.7 mg/dL      Bilirubin Direct 0.2 mg/dL      Bilirubin Indirect 0.5 mg/dL      AST (SGOT) 41 U/L      ALT 32 U/L      Alkaline Phosphatase 57 U/L      Protein, Total 7.0 g/dL      Albumin 3.8 g/dL      Globulin 3.2 g/dL      Albumin/Globulin Ratio 1.2    CBC and differential [[id number]]  (Abnormal) Collected: 11/09/19 0105    Specimen: Blood Updated: 11/09/19 0123     WBC 13.33 x10 3/uL      Hgb 16.9 g/dL      Hematocrit 78.2 %      Platelets 231 x10 3/uL      RBC 5.17 x10 6/uL      MCV 96.1 fL      MCH 32.7 pg      MCHC 34.0 g/dL      RDW 15 %      MPV 8.7 fL      Neutrophils 42.5 %      Lymphocytes Automated 47.5 %      Monocytes 7.4 %      Eosinophils Automated 1.3 %      Basophils Automated 0.6 %      Immature Granulocytes 0.7 %      Nucleated RBC 0.0 /100 WBC      Neutrophils Absolute 5.68 x10 3/uL      Lymphocytes Absolute Automated 6.33 x10 3/uL      Monocytes Absolute Automated 0.98 x10 3/uL      Eosinophils Absolute Automated 0.17 x10 3/uL      Basophils Absolute Automated 0.08 x10 3/uL      Immature Granulocytes Absolute 0.09 x10 3/uL      Absolute NRBC 0.00 x10 3/uL         Imaging personally reviewed, including: all available   XR Chest  AP Portable    Result Date: 11/09/2019   1. Mild left basilar atelectasis. Otherwise no acute changes. Ida Rogue, MD  11/09/2019 2:17 AM      Safety Checklist  DVT prophylaxis:  CHEST guideline (See page e199S) Chemical and Mechanical     This note was generated by the Epic EMR system/ Dragon speech recognition and may contain inherent errors or omissions not intended by the user. Grammatical errors, random word insertions, deletions and pronoun errors  are occasional consequences of this technology due to software limitations. Not all errors are caught or corrected. If  there are questions or concerns about the content of this note or information contained within the body of this dictation they should be addressed directly with the author for clarification.    Signed by: Lance Coon, MD   cc:Pcp, None, MD
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
## 2019-11-09 NOTE — UM Notes (Signed)
Marland KitchenMallie Darting RN, BSN @ [phone].     6/17    39 YO W/ NO SIGNIFICANT PMH WAS FOUND BY THE POLICE IN THE PARKING LOT @ 711. UNRESPONSIVE AND NO PULSE. GIVEN 8 MG NARCAN INTRANASAL & CPR STARTED. WHEN EMS ARRIVED, PT WAS HAVING AGONAL RESPIRATIONS. CONT CPR FOR  1 ROUND--> PULSE RETURNED & PT. WOKE UP, ALERT AND SLURRING WORDS      Pulse (!) 105   BP 134/90   Resp 20   SpO2 (!) 89 %   Temp 97.8 F (36.6 C)    EXAM  Neurological: Alert Normal and symmetric motor tone by observation. Speech slurred. Memory poor. No facial assymetry.  Psychiatric: Drowsy affect. Poor concentration        LAB:  CBC and differential [[id number]] (Abnormal) Collected: 11/09/19 0105   Specimen: Blood Updated: 11/09/19 0123    WBC 13.33High  x10 3/uL     Hgb 16.9 g/dL     Hematocrit 74.0CXKG  %     Platelets 231 x10 3/uL        AST (SGOT) 41High      Basic Metabolic Panel [[id number]] (Abnormal) Collected: 11/09/19 0105   Specimen: Blood Updated: 11/09/19 0141    Glucose 169High  mg/dL     BUN 5Low  mg/dL     Creatinine 1.1 mg/dL     Calcium 8.7 mg/dL     Sodium 702 mEq/L     Potassium 3.2Low  mEq/L     Chloride 104 mEq/L     CO2 22 mEq/L     Anion Gap 18.0High        EKG  ST          Medication Administration from 11/09/2019 0100 to 11/09/2019 6378     Date/Time Order Dose Route Action Action by Comments    11/09/2019 0224 ondansetron (ZOFRAN) injection 8 mg 8 mg Intravenous Given Nancy Fetter, RN     11/09/2019 0324 sodium chloride 0.9 % bolus 1,000 mL 0 mL Intravenous Stopped Jethro Poling, [place]     11/09/2019 0224 sodium chloride 0.9 % bolus 1,000 mL 1,000 mL Intravenous [address] Nancy Fetter, RN     11/09/2019 0428 sodium chloride 0.9 % bolus 1,000 mL 1,000 mL Intravenous [address] Jethro Poling, [place]     11/09/2019 5885 sodium chloride (PF) 0.9 % injection 3 mL 3 mL Intravenous Not Given Bernadene Person, RN     11/09/2019 0625 potassium chloride (KLOR-CON) CR tablet 40 mEq 40 mEq Oral Given Jethro Poling, RN       Admit to Inpatient [phone] [id number])  Admission  Date: 11/09/2019 Department: [facility] Intermediate Care Ordering/Authorizing: Dot Been, MD     W/DX OPIATE OD, HYPOXIA  Plan:   -Admit to [facility] Hospitalist service    #Out of the hospital cardiac arrest  -unknown total downtime  -per the ER note, patient found unresponsive by police, no pulse, given 8 mg of narcan intranasal. CPR started by PD. EMS called, when EMS arrived, pt agonal respirations, continued CPR for 1 round, pulse returned and patient became awake. Upon arrival to the ER, pt was awake and talking.  -monitor neurological status    #Acute hypoxic respiratory failure  -hypoxic to upper 80's off oxygen.  -likely from mild left basilar atelectasis seen on CXR  -aggressive pulmonary hygiene.  -Continue supplemental oxygen for O2 saturation goal of greater than 92%  -wean as tolerated  -incentive spirometry    #Leukocytosis  #Acute  hypokalemia  #Opiate overdose  #Hx nephrolithiasis s/p cystoscopy in 11/2015    #DVT ppx-Lovenox 40mg  qDaily & SCDs  #Code status-Full  #Diet-Regular

## 2019-11-09 NOTE — Consults (Signed)
CARDIOLOGY INPATIENT CONSULTATION   PROBLEM LIST  Date:  11/09/2019      Patient's Name: [name] [name]  DOB:    05-Dec-1980.    Date of Admission:   11/09/2019  Attending:    Ilda Foil, MD     Reason for Consult: Cardiac arrest    ASSESSMENT AND PLAN     Out of hospital cardiac arrest-in the setting of substance use and excessive alcohol consumption.  Patient admits to using cocaine the night before and consuming large amounts of alcohol the night of the incident.  Unfortunately, toxicology screen not performed at the time of patient's presentation.  Negative troponins and normal ECG.  Telemetry without arrhythmias overnight.  Obtain TTE to evaluate cardiac structures and function.  Continue to monitor for any significant arrhythmias on telemetry.  Emphasized to patient the need for abstaining from substances/EtOH.    Case discussed with Dr. Elise Benne.  _____________________________    Alinda Money, MD, MPH, [facility]  Cardiology - [facility]    Office:  [phone]  [facility] - ext. [phone]    [facility]  - ext. [phone]  [facility] - ext. [phone]  _____________________________    HPI     39 y.o. male with h/o no past medical history who was brought in following out of hospital cardiac arrest.  Patient was found down and unresponsive by police in the parking lot of [address] after being called for disturbance.  He was given intranasal Narcan.  CPR was started by police for no pulse.  At the time of patient's arrival to the ED, patient was awake, alert and talking.    Patient states that he used cocaine the night before the incident.  He had also consumed large amounts of alcohol before he became unresponsive.  He denies any prior cardiac history.  He states that he is generally in good health and only occasionally takes ibuprofen.    REVIEW OF SYSTEMS     All other systems were reviewed and were negative except as noted in HPI.    CARDIAC DIAGNOSTICS     ECG:  Sinus tachycardia - 11/09/2019    Telemetry (indepedently reviewed by me): Sinus rhythm    Rad. Imaging:  XR Chest  AP Portable    Result Date: 11/09/2019   1. Mild left basilar atelectasis. Otherwise no acute changes. Ida Rogue, MD  11/09/2019 2:17 AM        MEDICATIONS/ALLERGY         Current Facility-Administered Medications   Medication Dose Route Frequency    enoxaparin  40 mg Subcutaneous Daily    sodium chloride (PF)  3 mL Intravenous Q8H      acetaminophen **OR** acetaminophen, Nursing communication: Adult Hypoglycemia Treatment Algorithm **AND** dextrose **AND** dextrose **AND** glucagon (rDNA), melatonin, naloxone, ondansetron **OR** ondansetron    No Known Allergies     PMH/PSH     Past Medical History:   Diagnosis Date    Gout      Past Surgical History:   Procedure Laterality Date    CYSTOSCOPY, URETEROSCOPY, LASER LITHOTRIPSY Left 11/29/2015    Procedure: CYSTOSCOPY, URETEROSCOPY, RETROGRADE PYELOGRAM  AND INSERTION OF LEFT DOUBLE  J STENT . ;  Surgeon: Prescilla Sours, MD;  Location: [facility];  Service: Urology;  Laterality: Left;  **IP-[id number]/MD AVAIL 1600**       SOCIAL HISTORY     Social History     Tobacco Use    Smoking  status: Current Every Day Smoker    Smokeless tobacco: Never Used   Substance Use Topics    Alcohol use: Yes     Comment: social       FAMILY  HISTORY   [place]    PHYSICAL EXAM     BP 123/77    Pulse 73    Temp 97.7 F (36.5 C) (Oral)    Resp 18    Wt 88.5 kg (195 lb 1.7 oz)    SpO2 96%    BMI 27.21 kg/m      Intake/Output Summary (Last 24 hours) at 11/09/2019 1547  Last data filed at 11/09/2019 0324  Gross per 24 hour   Intake 1000 ml   Output    Net 1000 ml     General: no distress   Head: NC/AT  Eyes: no conjunctival pallor, no icterus   ENMT: MMM, no oral ulcers  Neck: No thyromegaly or lymphadenopathy  Respiratory: clear to auscultation, no rales or rhonchi or wheezes  Cardiovascular: no JVD, no carotid bruits. normal rate, regular rhythm, normal S1 and S2; no  heaves or lifts; no murmurs, no gallops or rubs; PMI non-displaced   GI: + BS, soft, non-tender, no organomegaly appreciated  Ext: warm, no edema, no cyanosis   MSK: no joint swelling, tenderness or deformity  Skin: warm and dry, no rashes.   Neuro/Psych: alert and oriented x 3, normal affect      LAB RESULTS     Basic Metabolic Profile   Recent Labs   Lab 11/09/19  1135 11/09/19  0105   Sodium 144 144   Potassium 4.1 3.2*   Chloride 109 104   CO2 22 22   BUN 4* 5*   Creatinine 0.8 1.1   EGFR >60.0 >60.0   Glucose 67* 169*   Calcium 8.2* 8.7        CBC w/Diff   Recent Labs   Lab 11/09/19  1135 11/09/19  0105   WBC 10.76* 13.33*   Hgb 16.0 16.9   Hematocrit 48.1 49.7*   Platelets 202 231        Cardiac Biomarkers   Recent Labs   Lab 11/09/19  1135 11/09/19  0105   Troponin I <0.01 <0.01            Cholesterol Panel   No results found for: CHOL, HDL, LDL, TRIG      Endocrine   No results found for: HGBA1C, TSH, FREET3      Coagulation Studies            This note was generated by the Epic EMR system/Dragon speech recognition and may contain inherent errors or omissions not intended by the user. Grammatical errors, random word insertions, deletions and pronoun errors  are occasional consequences of this technology due to software limitations. Not all errors are caught or corrected. If there are questions or concerns about the content of this note or information contained within the body of this dictation they should be addressed directly with the author for clarification.

## 2019-11-09 NOTE — Progress Notes (Signed)
PATIENT IS UNINSURED, CM NOTIFIED [employer](FIN[employer]CIAL [employer]).       11/09/19 1416   Patient Type   Within 30 Days of Previous Admission? No   Healthcare Decisions   Interviewed: Patient   Orientation/Decision Making Abilities of Patient Alert and Oriented x3, able to make decisions   Advance Directive Patient does not have advance directive   Additional Emergency Contacts? Aris Lot, MOTHER [phone]   Prior to admission   Prior level of function Independent with ADLs;Ambulates independently   Home Layout Two level   Have running water, electricity, heat, etc? Yes   Living Arrangements Family members   How do you get to your MD appointments? SELF   How do you get your groceries? SELF   Who fixes your meals? SELF   Who does your laundry? SELF   Who picks up your prescriptions? SELF   Dressing Independent   Grooming Independent   Feeding Independent   Bathing Independent   Toileting Independent   Discharge Planning   Support Systems Family members;Parent   Patient expects to be discharged to: HOME   Anticipated [place] plan discussed with: Same as interviewed   [place] discussion contact information: PATIENT   Potential barriers to discharge: No insurance / under insured   Mode of transportation: Private car (family member)   Does the patient have perscription coverage? Yes   Consults/Providers   Outcome Palliative Care Screen Screened but did not meet criteria for intervention   Correct PCP listed in Epic? Yes   Family and PCP   PCP on file was verified as the current PCP? Patient/family states they do not have a PCP  (PT WILL BE REFERRED TO [facility])   In case you are admitted, would like family notified? No   In case you are admitted, would like your PCP notified? Unknown

## 2019-11-09 NOTE — ED Triage Notes (Signed)
pt presents via ems s/p OD w/cardiac arrest and ROSC, police did CPR and gave 8mg  IN narcan, arrives aaox3, dexi 161, admits to etoh and cocaine.

## 2019-11-09 NOTE — Progress Notes (Signed)
Patient admitted to unit. Patient alert and oriented x 3. Patient on 3 L NC. Oriented patient to unit. Patient on room air. Monitored labs. Explained plan of care with patient. All questions addressed. 1:1 sitter for safety. Explained to patient on visitation policy. Safety precautions ( bed alarm. Socks, 2/4 side rails, call light in reach) in place.

## 2019-11-09 NOTE — ED Provider Notes (Signed)
[name]       Visit date: 11/09/2019      CLINICAL SUMMARY          Diagnosis:    .     Final diagnoses:   Opiate overdose, accidental or unintentional, initial encounter   Hypoxia         MDM Notes:        Differential Diagnosis considered by MD (not completely inclusive): Alcohol intoxication, drug intoxication, drug adverse reaction, drug overdose, metabolic dysfunction, respiratory arrest, encephalopathy, cardiac arrest, Pneumothorax, Unstable Angina, Pericarditis, Aortic Dissection, Esophageal Perforation, Pleurisy, Musculoskeletal, pulmonary embolism, acute coronary syndrome, myocardial infarction, myocarditis    Each of the differential diagnosis has been considered for diagnosis and weighed risk and benefit of further testing and evaluation in the context of patient complaint. Some diagnosis do not warrant further testing including imaging and/or labs. However, all differential diagnosis have been considered.    Vital Signs: Reviewed the patients vital signs.   Nursing Notes: Reviewed and utilized available nursing notes.  Medical Records Reviewed: Reviewed available past medical records.  Counseling: The emergency provider has spoken with the patient and discussed todays findings, in addition to providing specific details for the plan of care.  Questions are answered and there is agreement with the plan.      Attempted to wean patient from oxygen however off oxygen patient's pulse ox drops to 89%.  Patient given fluids in the ED.  Will admit to [facility] for close observation.           Disposition:      Disposition:  ED Disposition     ED Disposition Condition Date/Time Comment    Admit  Thu Nov 09, 2019  5:10 AM Admitting Physician: Lance Coon [[id number]]   Service:: Medicine [[id number]]   Estimated Length of Stay: > or = to 2 midnights   Tentative Discharge Plan?: Home or Self Care [1]   Does patient need telemetry?: Yes   Telemetry type (separate Telemetry order is  also required):: Cardiac telemetry            Prescriptions:  Patient's Medications   New Prescriptions    No medications on file   Previous Medications    IBUPROFEN (ADVIL) 600 MG TABLET    Take 1 tablet (600 mg total) by mouth every 6 (six) hours as needed for Pain   Modified Medications    No medications on file   Discontinued Medications    No medications on file                   CLINICAL INFORMATION        HPI:      Chief Complaint: Drug Overdose  .    [name] [name] is a 39 y.o. male who presents with cardiac arrest. Patient found down by police in parking lot of [address] after being called for disturbance. Patient found unresponsive by police, no pulse, given 8 mg of narcan intranasal. CPR started by PD. EMS called, when EMS arrived, pt agonal respirations, continued CPR for 1 round, pulse returned and patient became awake, when arrived at ED, awake alert and talking. Slurring words, refusing to answer questions about drug use.      1. What was patient doing when symptoms started (Context): see above  2. Duration: ongoing since timing above  3. Location: cardiac  4. Severity: severe  5. Activities that worsen symptoms: n/a  6. Activities that improve symptoms: n/a  7. Quality:  n/a  8. Radiation of symptoms: N/A  9. Associated signs and Symptoms: see above  10. Are symptoms worsening? yes    History obtained from: Patient          ROS:      Caveat: Unable to complete ROS due to patient mental status      Physical Exam:      Pulse (!) 105   BP 134/90   Resp 20   SpO2 (!) 89 %   Temp 97.8 F (36.6 C)    Constitutional: Vital signs reviewed. Drowsy but arousable.  Head: Normocephalic, atraumatic  Eyes: No conjunctival injection. No discharge. PERRL, EOMI  ENT: Mucous membranes moist, clear oropharynx  Neck: Normal range of motion. Trachea midline.  Respiratory/Chest: clear to auscultation. No wheezes, rales or rhonchi. Good air movement.  No dyspnea or work of breathing.  Cardiovascular: Regular rate and rhythm. No  murmur. No rubs, murmurs, gallops  Abdomen: soft and nontender in all quadrants. No guarding or rebound. No masses or hepatosplenomegaly..  Back: no CVA tenderness  Upper Extremities:No edema. No cyanosis. No deformity.  Lower Extremities: No edema. No cyanosis. No deformity.  Neurological: Alert Normal and symmetric motor tone by observation. Speech slurred. Memory poor. No facial assymetry.  Skin: Warm and dry. No rash.  Psychiatric: Drowsy affect. Poor concentration.              PAST HISTORY        Primary Care Provider: Pcp, None, MD        PMH/PSH:    .     Past Medical History:   Diagnosis Date    Gout        He has a past surgical history that includes CYSTOSCOPY, URETEROSCOPY, LASER LITHOTRIPSY (Left, 11/29/2015).          Social/Family History:      He reports that he has been smoking. He has never used smokeless tobacco. He reports current alcohol use. He reports that he does not use drugs.    History reviewed. No pertinent family history.    If I counseled patient face to face for greater than 3 minutes but less than 9 to stop the use of  tobacco and/or electronic nicotine delivery system and provided cessation strategies and discussed risks: N/A      Listed Medications on Arrival:    .     Home Medications             ibuprofen (ADVIL) 600 MG tablet     Take 1 tablet (600 mg total) by mouth every 6 (six) hours as needed for Pain         Allergies: He has No Known Allergies.            VISIT INFORMATION        Clinical Course in the ED:             Medications Given in the ED:    .     ED Medication Orders (From admission, onward)    Start Ordered     Status Ordering Provider    11/09/19 0424 11/09/19 0423  sodium chloride 0.9 % bolus 1,000 mL  Once     Route: Intravenous  Ordered Dose: 1,000 mL     Last MAR action: [employer] [place] B    11/09/19 0218 11/09/19 0217  sodium chloride 0.9 % bolus 1,000 mL  Once     Route: Intravenous  Ordered Dose: 1,000 mL  Last MAR action: Stopped Gracelyn Nurse B     11/09/19 0217 11/09/19 0216  ondansetron (ZOFRAN) injection 8 mg  Once     Route: Intravenous  Ordered Dose: 8 mg     Last MAR action: Given Deidrea Gaetz B            Procedures:      Procedures         Medical Decision Making - Interpretations (DATA):          EKG was Reviewed, Analyzed and Interpreted by me: Yes: Sinus tachycardia, rate of 102, normal axis, no st-changes    Rhythm Strip Interpretation / Cardiac Monitor Analysis: Yes: Sinus tachycardia 101    Pulse Ox Analysis: Yes: saturation: (!) 89 %  ; Oxygen use: room air; Interpretation: Hypoxic      Laboratory results reviewed and interpreted by me: Yes  Review and/or order of medical tests: Yes  Radiologic study results reviewed/ordered by me: Yes  Radiologic Studies interpreted by me: Yes, chest x-ray shows no acute infiltrate    Discuss tests with performing physician: N/A  Discuss the patient with other providers: N/A  Reviewed and summarized old records or history from a person other than the patient: N/A       All tests, tracings, EKGs, vital signs and radiological studies above where applicable were interpreted independently by me, Gracelyn Nurse MD.                RESULTS        Lab Results:      Results     Procedure Component Value Units Date/Time    COVID-19 (SARS-COV-2) Verne Carrow Rapid) [[id number]] Collected: 11/09/19 0525    Specimen: Nasopharyngeal Swab from Nasopharynx Updated: 11/09/19 0531     Purpose of COVID testing Screening     SARS-CoV-2 Specimen Source Nasopharyngeal    Narrative:      Screening    Troponin I [[id number]] Collected: 11/09/19 0105    Specimen: Blood Updated: 11/09/19 0150     Troponin I <0.01 ng/mL     Magnesium [[id number]] Collected: 11/09/19 0105    Specimen: Blood Updated: 11/09/19 0141     Magnesium 2.1 mg/dL     GFR [[id number]] Collected: 11/09/19 0105     Updated: 11/09/19 0141     EGFR >60.0    Basic Metabolic Panel [[id number]]  (Abnormal) Collected: 11/09/19 0105    Specimen: Blood Updated: 11/09/19 0141     Glucose  169 mg/dL      BUN 5 mg/dL      Creatinine 1.1 mg/dL      Calcium 8.7 mg/dL      Sodium 253 mEq/L      Potassium 3.2 mEq/L      Chloride 104 mEq/L      CO2 22 mEq/L      Anion Gap 18.0    Hepatic function panel (LFT) [[id number]]  (Abnormal) Collected: 11/09/19 0105    Specimen: Blood Updated: 11/09/19 0141     Bilirubin, Total 0.7 mg/dL      Bilirubin Direct 0.2 mg/dL      Bilirubin Indirect 0.5 mg/dL      AST (SGOT) 41 U/L      ALT 32 U/L      Alkaline Phosphatase 57 U/L      Protein, Total 7.0 g/dL      Albumin 3.8 g/dL      Globulin 3.2 g/dL      Albumin/Globulin Ratio 1.2    CBC  and differential [[id number]]  (Abnormal) Collected: 11/09/19 0105    Specimen: Blood Updated: 11/09/19 0123     WBC 13.33 x10 3/uL      Hgb 16.9 g/dL      Hematocrit 38.7 %      Platelets 231 x10 3/uL      RBC 5.17 x10 6/uL      MCV 96.1 fL      MCH 32.7 pg      MCHC 34.0 g/dL      RDW 15 %      MPV 8.7 fL      Neutrophils 42.5 %      Lymphocytes Automated 47.5 %      Monocytes 7.4 %      Eosinophils Automated 1.3 %      Basophils Automated 0.6 %      Immature Granulocytes 0.7 %      Nucleated RBC 0.0 /100 WBC      Neutrophils Absolute 5.68 x10 3/uL      Lymphocytes Absolute Automated 6.33 x10 3/uL      Monocytes Absolute Automated 0.98 x10 3/uL      Eosinophils Absolute Automated 0.17 x10 3/uL      Basophils Absolute Automated 0.08 x10 3/uL      Immature Granulocytes Absolute 0.09 x10 3/uL      Absolute NRBC 0.00 x10 3/uL               Radiology Results:      XR Chest  AP Portable   Final Result          1. Mild left basilar atelectasis. Otherwise no acute changes.       Ida Rogue, MD    11/09/2019 2:17 AM

## 2019-11-10 ENCOUNTER — Inpatient Hospital Stay (HOSPITAL_COMMUNITY): Payer: Self-pay

## 2019-11-10 DIAGNOSIS — I469 Cardiac arrest, cause unspecified: Secondary | ICD-10-CM

## 2019-11-10 DIAGNOSIS — F1990 Other psychoactive substance use, unspecified, uncomplicated: Secondary | ICD-10-CM

## 2019-11-10 DIAGNOSIS — R0902 Hypoxemia: Secondary | ICD-10-CM

## 2019-11-10 LAB — ECHOCARDIOGRAM ADULT COMPLETE W CLR/ DOPP WAVEFORM
AV Peak Velocity: 144
AV Peak Velocity: 144
Ao Root Diameter (2D): 3.3
Ao Root Diameter (2D): 3.3
BP Mod LV Ejection Fraction: 81.8
IVS Diastolic Thickness (2D): 1.16
IVS Diastolic Thickness (2D): 1.16
LA Dimension (2D): 3.1
LA Dimension (2D): 3.1
LA Volume Index (BP A-L): 0.018
LVID diastole (2D): 4.54
LVID diastole (2D): 4.54
LVID systole (2D): 2.19
LVID systole (2D): 2.19
MV Area (PHT): 3.27
MV E/A: 2
MV E/A: 2
MV E/A: 2.016
MV E/e' (Average): 11.012
Mitral Valve Findings: NORMAL
RV Basal Diastolic Dimension: 2.19
RV Basal Diastolic Dimension: 2.19
RV Systolic Pressure: 19
RV Systolic Pressure: 19
RV Systolic Pressure: 19.16
TAPSE: 1.46
TAPSE: 1.46
Tricuspid Valve Findings: NORMAL

## 2019-11-10 LAB — CBC AND DIFFERENTIAL
Absolute NRBC: 0 10*3/uL (ref 0.00–0.00)
Basophils Absolute Automated: 0.05 10*3/uL (ref 0.00–0.08)
Basophils Automated: 0.5 %
Eosinophils Absolute Automated: 0.15 10*3/uL (ref 0.00–0.44)
Eosinophils Automated: 1.6 %
Hematocrit: 47.1 % (ref 37.6–49.6)
Hgb: 15.7 g/dL (ref 12.5–17.1)
Immature Granulocytes Absolute: 0.02 10*3/uL (ref 0.00–0.07)
Immature Granulocytes: 0.2 %
Lymphocytes Absolute Automated: 3.62 10*3/uL — ABNORMAL HIGH (ref 0.42–3.22)
Lymphocytes Automated: 38.8 %
MCH: 32.1 pg (ref 25.1–33.5)
MCHC: 33.3 g/dL (ref 31.5–35.8)
MCV: 96.3 fL — ABNORMAL HIGH (ref 78.0–96.0)
MPV: 8.6 fL — ABNORMAL LOW (ref 8.9–12.5)
Monocytes Absolute Automated: 0.9 10*3/uL — ABNORMAL HIGH (ref 0.21–0.85)
Monocytes: 9.6 %
Neutrophils Absolute: 4.59 10*3/uL (ref 1.10–6.33)
Neutrophils: 49.3 %
Nucleated RBC: 0 /100 WBC (ref 0.0–0.0)
Platelets: 197 10*3/uL (ref 142–346)
RBC: 4.89 10*6/uL (ref 4.20–5.90)
RDW: 15 % (ref 11–15)
WBC: 9.33 10*3/uL (ref 3.10–9.50)

## 2019-11-10 LAB — COMPREHENSIVE METABOLIC PANEL
ALT: 27 U/L (ref 0–55)
AST (SGOT): 28 U/L (ref 5–34)
Albumin/Globulin Ratio: 1.2 (ref 0.9–2.2)
Albumin: 3.3 g/dL — ABNORMAL LOW (ref 3.5–5.0)
Alkaline Phosphatase: 61 U/L (ref 38–106)
Anion Gap: 10 (ref 5.0–15.0)
BUN: 7 mg/dL — ABNORMAL LOW (ref 9–28)
Bilirubin, Total: 1 mg/dL (ref 0.2–1.2)
CO2: 26 mEq/L (ref 22–29)
Calcium: 8.8 mg/dL (ref 8.5–10.5)
Chloride: 104 mEq/L (ref 100–111)
Creatinine: 0.9 mg/dL (ref 0.7–1.3)
Globulin: 2.8 g/dL (ref 2.0–3.6)
Glucose: 92 mg/dL (ref 70–100)
Potassium: 4.1 mEq/L (ref 3.5–5.1)
Protein, Total: 6.1 g/dL (ref 6.0–8.3)
Sodium: 140 mEq/L (ref 136–145)

## 2019-11-10 LAB — GFR: EGFR: >60

## 2019-11-10 NOTE — Discharge Summary (Signed)
MEDICINE DISCHARGE SUMMARY    Date Time: 11/10/19 12:45 PM  Patient Name: [name] [name],[name] [name]  Attending Physician: Ilda Foil, MD  Primary Care Physician: Marisa Sprinkles, MD    Date of Admission: 11/09/2019  Date of Discharge: 11/10/2019    Discharge Diagnoses:     Principal Diagnosis (Diagnosis after study, that is chiefly responsible for admission): Cardiac arrest suspected in setting of illicit drug use/cocaine and excessive alcohol use  Active Hospital Problems   No active problems to display.      Resolved Hospital Problems    Diagnosis POA    Principal Problem: Cardiac arrest Yes    Hypoxia Yes    Illicit drug use Yes     Disposition:    Disposition: home with family    Pending Results, Recommendations & Instructions to providers after discharge:     1. Micro / Labs / Path pending:   Unresulted Labs     None        2. Code Status: Full    Procedures/Radiology performed:   Echo 6/17--  Summary    * Left ventricular systolic function is normal with an ejection fraction  55-60%.    * No significant valvular abnormalities.      Radiology: all results from this admission  XR Chest  AP Portable    Result Date: 11/09/2019   1. Mild left basilar atelectasis. Otherwise no acute changes. Ida Rogue, MD  11/09/2019 2:17 AM    Surgery: all results from this admission  * No surgery found [facility] Course:     Reason for admission/ HPI: See H&P for full details regarding this patient's admission.  Briefly, the patient is a 39 year old male with history of gout, nephrolithiasis who presented to the hospital with cardiac arrest receiving 1 round of CPR per EMS.  Patient did report cocaine use on the evening prior to admission and also heavy alcohol use on the evening of admission.     Hospital Course: See below for in-hospital problems:    Cardiac arrest--suspected in the setting of cocaine use and heavy alcohol use  -Patient was seen by cardiology in hospital.  He had no events on telemetry and echocardiogram with no  significant valvular abnormalities and an ejection fraction of 55 to 60%.  Cardiology recommended no further in-hospital testing.  The patient was counseled at length on cessation of illicit drugs and alcohol use with risks including even stroke, heart attack, and death.  Patient verbalized understanding    Illicit drug use  -Patient reported recent cocaine use and was noted with urine toxicology screen positive for cocaine.  The patient was without events in hospital. The patient was counseled at length on cessation of illicit drugs and alcohol use with risks including even stroke, heart attack, and death.  Patient verbalized understanding    Leukocytosis--improved and suspect reactive in the setting of cardiac arrest  -The patient remained off of antibiotics in hospital.    Depression  -The patient denied any suicidal ideation including no thoughts of hurting or harming himself.  Patient was seen by psychiatry no further in-hospital interventions were recommended.    Hypoxia--suspected in the setting of atelectasis.  -The patient did have improvement in oxygenation including stable oxygen saturation on room air at the time of discharge.    Discharge condition: stable    Discharge Day Exam:  Today:  BP 109/70    Pulse (!) 54    Temp 97.2 F (36.2 C) (Oral)  Resp 19    Wt 88.5 kg (195 lb 1.7 oz)    SpO2 95%    BMI 27.21 kg/m   Ranges for the last 24 hours:  Temp:  [97.2 F (36.2 C)-98.1 F (36.7 C)] 97.2 F (36.2 C)  Heart Rate:  [54-61] 54  Resp Rate:  [16-19] 19  BP: (94-115)/(61-72) 109/70  Body mass index is 27.21 kg/m.    No intake or output data in the 24 hours ending 11/10/19 1245 General: awake, alert, in no acute distress  HEENT: anicteric, moist mucous membranes  Neck: supple, no JVD  Cardiovascular: nl S1, S2; no rub or gallop  Lungs: clear to auscultation bilaterally, no additional sounds, breathing comfortably with no use of accessory muscles  Abdomen: soft, non-tender, non-distended,  positive bowel sounds  Extremities: no edema, no cyanosis  Neurological: Alert and oriented X 3, moves all extremities with no focal deficits       Wounds/decutibus ulcers/stage:    Consultations:   Treatment Team:   Attending Provider: Ilda Foil, MD  Consulting Physician: Ilda Foil, MD  Consulting Physician: Marni Griffon, MD  Consulting Physician: Edwyna Shell, MD  Recent Labs - Last 2:       Recent Labs   Lab 11/10/19  0135 11/09/19  1135 11/09/19  0105   WBC 9.33 10.76* 13.33*   Hgb 15.7 16.0 16.9   Hematocrit 47.1 48.1 49.7*   Platelets 197 202 231         Recent Labs   Lab 11/10/19  0135 11/09/19  1135 11/09/19  0105   Sodium 140 144 144   Potassium 4.1 4.1 3.2*   Chloride 104 109 104   CO2 26 22 22    BUN 7* 4* 5*   Creatinine 0.9 0.8 1.1   EGFR >60.0 >60.0 >60.0   Glucose 92 67* 169*   Calcium 8.8 8.2* 8.7     Recent Labs   Lab 11/10/19  0135 11/09/19  0105   Alkaline Phosphatase 61 57   Bilirubin, Total 1.0 0.7   Bilirubin Direct  --  0.2   Protein, Total 6.1 7.0   Albumin 3.3* 3.8   ALT 27 32   AST (SGOT) 28 41*            Invalid input(s): FREET4         Microbiology Results     Procedure Component Value Units Date/Time    COVID-19 (SARS-COV-2) Verne Carrow Rapid) [[id number]] Collected: 11/09/19 0525    Specimen: Nasopharyngeal Swab from Nasopharynx Updated: 11/09/19 0549     Purpose of COVID testing Screening     SARS-CoV-2 Specimen Source Nasopharyngeal     SARS CoV 2 Overall Result Negative     Comment: Test performed using the Abbott ID NOW EUA assay.  Please see Fact Sheets for patients and providers located at:  http://www.rice.biz/    This test is for the qualitative detection of SARS-CoV-2  (COVID19) nucleic acid. Viral nucleic acids may persist in vivo,  independent of viability. Detection of viral nucleic acid does  not imply the presence of infectious virus, or that virus  nucleic acid is the cause of clinical symptoms. Negative  results should be treated as  presumptive and, if inconsistent  with clinical signs and symptoms or necessary for patient  management, should be tested with an alternative molecular  assay. Negative results do not preclude SARS-CoV-2 infection  and should not be used as the sole basis for patient  management  decisions. Invalid results may be due to inhibiting  substances in the specimen and recollection should occur.         Narrative:      Screening          Discharge Instructions & Follow Up Plan for Patient:   Discharge Diet: Diet regular  Activity/Weight Bearing Status: as tolerated   Patient was instructed to follow up with:     Follow-up Information     PCP. Schedule an appointment as soon as possible for a visit.                 Complete instructions and follow up are in the patient's After Visit Summary    Minutes spent coordinating discharge and reviewing discharge plan:28 minutes    Discharge Medications:        Medication List      CONTINUE taking these medications    ibuprofen 600 MG tablet  Commonly known as: ADVIL  Take 1 tablet (600 mg total) by mouth every 6 (six) hours as needed for Pain            Immunizations provided:   Immunization History   Administered Date(s) Administered    Tdap 12/23/2014       This note was generated by the Epic EMR system/ Dragon speech recognition and may contain inherent errors or omissions not intended by the user. Grammatical errors, random word insertions, deletions and pronoun errors  are occasional consequences of this technology due to software limitations. Not all errors are caught or corrected. If there are questions or concerns about the content of this note or information contained within the body of this dictation they should be addressed directly with the author for clarification.    Signed by: Ilda Foil, MD  [facility]   Department of Medicine  CC: Pcp, None, MD

## 2019-11-10 NOTE — Discharge Instr - AVS First Page (Addendum)
Reason for your Hospital Admission:  Cardiac arrest suspected in setting of illicit drug use and alcohol    Instructions for after your discharge:  Do not use illicit drugs  Do not use alcohol  Follow-up with your primary care physician

## 2019-11-10 NOTE — Progress Notes (Signed)
NO PCP OR HEALTH COVERAGE    CM PROVIDED INFO RE: [facility] [phone])  AND THE # TO DECO  FOR FINANCIAL ASSISTANCE FOR THE UNINSURED [phone])

## 2019-11-10 NOTE — Progress Notes (Signed)
Patient remains stable plan of care followed as ordered Patient teaching done with patient about using unprescribed meds  Which can cause so many illness in our bodies .Patient verbalized understanding of teaching.

## 2019-11-10 NOTE — Plan of Care (Signed)
Problem: Compromised Tissue integrity  Goal: Damaged tissue is healing and protected  Outcome: Progressing  Flowsheets (Taken 11/10/2019 0534)  Damaged tissue is healing and protected:   Monitor/assess Braden scale every shift   Relieve pressure to bony prominences for patients at moderate and high risk   Use incontinence wipes for cleaning urine, stool and caustic drainage. Foley care as needed   Encourage use of lotion/moisturizer on skin  Goal: Nutritional status is improving  Outcome: Progressing  Flowsheets (Taken 11/10/2019 0534)  Nutritional status is improving:   Assist patient with eating   Allow adequate time for meals   Encourage patient to take dietary supplement(s) as ordered   Collaborate with Clinical Nutritionist   Include patient/patient care companion in decisions related to nutrition     Problem: Compromised Tissue integrity  Goal: Damaged tissue is healing and protected  Outcome: Progressing  Flowsheets (Taken 11/10/2019 0534)  Damaged tissue is healing and protected:   Monitor/assess Braden scale every shift   Relieve pressure to bony prominences for patients at moderate and high risk   Use incontinence wipes for cleaning urine, stool and caustic drainage. Foley care as needed   Encourage use of lotion/moisturizer on skin     Problem: Compromised Tissue integrity  Goal: Nutritional status is improving  Outcome: Progressing  Flowsheets (Taken 11/10/2019 0534)  Nutritional status is improving:   Assist patient with eating   Allow adequate time for meals   Encourage patient to take dietary supplement(s) as ordered   Collaborate with Clinical Nutritionist   Include patient/patient care companion in decisions related to nutrition     Problem: Safety  Goal: Patient will be free from infection during hospitalization  Flowsheets (Taken 11/10/2019 0534)  Free from Infection during hospitalization:   Assess and monitor for signs and symptoms of infection   Monitor lab/diagnostic results   Encourage patient  and family to use good hand hygiene technique   Monitor all insertion sites (i.e. indwelling lines, tubes, urinary catheters, and drains)     Problem: Safety  Goal: Patient will be free from infection during hospitalization  Flowsheets (Taken 11/10/2019 0534)  Free from Infection during hospitalization:   Assess and monitor for signs and symptoms of infection   Monitor lab/diagnostic results   Encourage patient and family to use good hand hygiene technique   Monitor all insertion sites (i.e. indwelling lines, tubes, urinary catheters, and drains)

## 2019-11-10 NOTE — Progress Notes (Signed)
Patient Active Problem List    Diagnosis Date Noted    Opiate overdose, accidental or unintentional, initial encounter 11/09/2019    Hypoxia 11/09/2019    Cardiac arrest 11/09/2019    Illicit drug use 11/09/2019    S/P cystoscopy 12/01/2015    Nephrolithiasis 11/28/2015         Asessment and plan:  39 yo male found unresponsive and pulseless in setting of EtOH and cocaine use requiring CPR. Awakened after 1 round of CPR. No shocks given. ECG without significant abnormalities and troponin normal. Echocardiogram today with normal LV function to my personal review. No further cardiac testing required.       Subjective:  Pt denies CP, SOB, or palpitations.     Exam:  BP 115/71    Pulse (!) 56    Temp 97.7 F (36.5 C) (Oral)    Resp 18    Wt 88.5 kg (195 lb 1.7 oz)    SpO2 95%    BMI 27.21 kg/m     HEENT: JVP to 5cm   Chest: clear to auscultation bilaterally  Heart: regular rate and rhythm  Abd: soft, NT, ND  Extr: No edema  Neuro: oriented    Telemetry independently reviewed by me shows sinus rhythm 50s-70s    Labs:    Lab Results   Component Value Date    BUN 7 (L) 11/10/2019    NA 140 11/10/2019    K 4.1 11/10/2019    CL 104 11/10/2019    CO2 26 11/10/2019       Lab Results   Component Value Date    WBC 9.33 11/10/2019    HGB 15.7 11/10/2019    HCT 47.1 11/10/2019    MCV 96.3 (H) 11/10/2019    PLT 197 11/10/2019       No results found for: CHOL  No results found for: HDL  No components found for: LDLCALC  No results found for: TRIG  No components found for: CHOLHDL    No components found for: DIGOXIN    No results found for: CKTOTAL, CKMB, CKMBINDEX      Leamon Arnt MD  [facility]    Pg [phone]  Office [phone]

## 2019-11-12 LAB — ECG 12-LEAD
Atrial Rate: 102 {beats}/min
P Axis: 43 degrees
P-R Interval: 166 ms
Q-T Interval: 354 ms
QRS Duration: 92 ms
QTC Calculation (Bezet): 461 ms
R Axis: 45 degrees
T Axis: 45 degrees
Ventricular Rate: 102 {beats}/min

## 2022-08-09 ENCOUNTER — Emergency Department: Payer: BC Managed Care – PPO

## 2022-08-09 ENCOUNTER — Emergency Department
Admission: EM | Admit: 2022-08-09 | Discharge: 2022-08-09 | Disposition: A | Discharge status: Home / Self Care | Payer: BC Managed Care – PPO | Attending: Emergency Medicine | Admitting: Emergency Medicine

## 2022-08-09 DIAGNOSIS — R42 Dizziness and giddiness: Secondary | ICD-10-CM | POA: Insufficient documentation

## 2022-08-09 DIAGNOSIS — M5412 Radiculopathy, cervical region: Secondary | ICD-10-CM | POA: Insufficient documentation

## 2022-08-09 DIAGNOSIS — R0602 Shortness of breath: Secondary | ICD-10-CM | POA: Insufficient documentation

## 2022-08-09 DIAGNOSIS — R9431 Abnormal electrocardiogram [ECG] [EKG]: Secondary | ICD-10-CM

## 2022-08-09 DIAGNOSIS — M5431 Sciatica, right side: Secondary | ICD-10-CM | POA: Insufficient documentation

## 2022-08-09 HISTORY — DX: Depression, unspecified: F32.A

## 2022-08-09 HISTORY — DX: Hyperlipidemia, unspecified: E78.5

## 2022-08-09 HISTORY — DX: Acute myocardial infarction, unspecified: I21.9

## 2022-08-09 HISTORY — DX: Anxiety disorder, unspecified: F41.9

## 2022-08-09 LAB — COMPREHENSIVE METABOLIC PANEL
ALT: 63 U/L — ABNORMAL HIGH (ref 0–55)
AST (SGOT): 37 U/L (ref 5–41)
Albumin/Globulin Ratio: 1.1 (ref 0.9–2.2)
Albumin: 4.1 g/dL (ref 3.5–5.0)
Alkaline Phosphatase: 75 U/L (ref 37–117)
Anion Gap: 9 (ref 5.0–15.0)
BUN: 8 mg/dL — ABNORMAL LOW (ref 9.0–28.0)
Bilirubin, Total: 0.9 mg/dL (ref 0.2–1.2)
CO2: 22 mEq/L (ref 17–29)
Calcium: 9.2 mg/dL (ref 8.5–10.5)
Chloride: 107 mEq/L (ref 99–111)
Creatinine: 0.9 mg/dL (ref 0.5–1.5)
Globulin: 3.6 g/dL (ref 2.0–3.6)
Glucose: 104 mg/dL — ABNORMAL HIGH (ref 70–100)
Potassium: 3.9 mEq/L (ref 3.5–5.3)
Protein, Total: 7.7 g/dL (ref 6.0–8.3)
Sodium: 138 mEq/L (ref 135–145)
eGFR: >60 mL/min/{1.73_m2} (ref >=60)

## 2022-08-09 LAB — CBC AND DIFFERENTIAL
Absolute NRBC: 0 10*3/uL (ref 0.00–0.00)
Basophils Absolute Automated: 0.05 10*3/uL (ref 0.00–0.08)
Basophils Automated: 0.5 %
Eosinophils Absolute Automated: 0.16 10*3/uL (ref 0.00–0.44)
Eosinophils Automated: 1.8 %
Hematocrit: 44.8 % (ref 37.6–49.6)
Hgb: 15.5 g/dL (ref 12.5–17.1)
Immature Granulocytes Absolute: 0.02 10*3/uL (ref 0.00–0.07)
Immature Granulocytes: 0.2 %
Instrument Absolute Neutrophil Count: 4.33 10*3/uL (ref 1.10–6.33)
Lymphocytes Absolute Automated: 3.71 10*3/uL — ABNORMAL HIGH (ref 0.42–3.22)
Lymphocytes Automated: 40.7 %
MCH: 29.6 pg (ref 25.1–33.5)
MCHC: 34.6 g/dL (ref 31.5–35.8)
MCV: 85.5 fL (ref 78.0–96.0)
MPV: 9.1 fL (ref 8.9–12.5)
Monocytes Absolute Automated: 0.84 10*3/uL (ref 0.21–0.85)
Monocytes: 9.2 %
Neutrophils Absolute: 4.33 10*3/uL (ref 1.10–6.33)
Neutrophils: 47.6 %
Nucleated RBC: 0 /100 WBC (ref 0.0–0.0)
Platelets: 249 10*3/uL (ref 142–346)
RBC: 5.24 10*6/uL (ref 4.20–5.90)
RDW: 13 % (ref 11–15)
WBC: 9.11 10*3/uL (ref 3.10–9.50)

## 2022-08-09 LAB — ECG 12-LEAD
QRS Duration: 150 ms
R Axis: 120 degrees

## 2022-08-09 LAB — HIGH SENSITIVITY TROPONIN-I: hs Troponin-I: <2.7 ng/L

## 2022-08-09 MED ORDER — SODIUM CHLORIDE 0.9 % IV BOLUS
1000.00 mL | Freq: Once | INTRAVENOUS | Status: AC
Start: 2022-08-09 — End: 2022-08-09
  Administered 2022-08-09: 1000 mL via INTRAVENOUS

## 2022-08-09 MED ORDER — LIDOCAINE 5 % EX PTCH
1.00 | MEDICATED_PATCH | Freq: Once | CUTANEOUS | Status: DC
Start: 2022-08-09 — End: 2022-08-09
  Administered 2022-08-09: 1 via TRANSDERMAL
  Filled 2022-08-09: qty 1

## 2022-08-09 MED ORDER — CYCLOBENZAPRINE HCL 10 MG PO TABS
10.0000 mg | ORAL_TABLET | Freq: Once | ORAL | Status: DC
Start: 2022-08-09 — End: 2022-08-09

## 2022-08-09 MED ORDER — METOCLOPRAMIDE HCL 5 MG/ML IJ SOLN
10.00 mg | Freq: Once | INTRAMUSCULAR | Status: AC
Start: 2022-08-09 — End: 2022-08-09
  Administered 2022-08-09: 10 mg via INTRAVENOUS
  Filled 2022-08-09: qty 2

## 2022-08-09 MED ORDER — KETOROLAC TROMETHAMINE 30 MG/ML IJ SOLN
15.00 mg | Freq: Once | INTRAMUSCULAR | Status: AC
Start: 2022-08-09 — End: 2022-08-09
  Administered 2022-08-09: 15 mg via INTRAVENOUS
  Filled 2022-08-09: qty 1

## 2022-08-09 MED ORDER — CYCLOBENZAPRINE HCL 10 MG PO TABS
10.00 mg | ORAL_TABLET | Freq: Three times a day (TID) | ORAL | 0 refills | Status: AC | PRN
Start: 2022-08-09 — End: 2022-08-24

## 2022-08-09 MED ORDER — FAMOTIDINE 20 MG PO TABS
20.00 mg | ORAL_TABLET | Freq: Two times a day (BID) | ORAL | 0 refills | Status: AC
Start: 2022-08-09 — End: 2022-08-24

## 2022-08-09 MED ORDER — FAMOTIDINE 10 MG/ML IV SOLN (WRAP)
20.00 mg | Freq: Once | INTRAVENOUS | Status: AC
Start: 2022-08-09 — End: 2022-08-09
  Administered 2022-08-09: 20 mg via INTRAVENOUS
  Filled 2022-08-09: qty 2

## 2022-08-09 MED ORDER — ONDANSETRON 4 MG PO TBDP
4.00 mg | ORAL_TABLET | Freq: Four times a day (QID) | ORAL | 0 refills | Status: AC | PRN
Start: 2022-08-09 — End: ?

## 2022-08-09 NOTE — ED Provider Notes (Signed)
EMERGENCY DEPARTMENT NOTE     Patient initially seen and examined at   ED PHYSICIAN ASSIGNED       Date/Time Event User Comments    08/09/22 1714 Physician Assigned Frazer Rain[place], Christella Scheuermann, MD assigned as Attending           ED MIDLEVEL (APP) ASSIGNED       None            HISTORY OF PRESENT ILLNESS     Chief Complaint: Dizziness and Shortness of Breath       42 y.o. male with past medical history as below presents with years of right upper extremity and right lower extremity pain, history of sciatica, endorses days of slow onset right-sided headache with some dizziness, denies any head injury, remote history of alcohol abuse, no IV drug use or history of cancer, no urinary or fecal incontinence, no recent injury, endorses shortness of breath when moving due to pain, no chest pain, no fever cough.  Patient recently restarted health insurance.     Additional History Provided by Independent Historian:  Goessel (other than patient): No        MEDICAL HISTORY     Past Medical History:  Past Medical History:   Diagnosis Date    Anxiety     Depression     Gout     Hyperlipidemia     Myocardial infarction     cardiac arrect-seizure-MI 2021       Past Surgical History:  Past Surgical History:   Procedure Laterality Date    ABSCESS DRAINAGE      right neck    CYSTOSCOPY, URETEROSCOPY, LASER LITHOTRIPSY Left 11/29/2015    Procedure: CYSTOSCOPY, URETEROSCOPY, RETROGRADE PYELOGRAM  AND INSERTION OF LEFT DOUBLE  J STENT . ;  Surgeon: Bonnye Fava, MD;  Location: [facility];  Service: Urology;  Laterality: Left;  **IP-332-1/MD AVAIL 1600**    LITHOTRIPSY         Social History:  Social History     Socioeconomic History    Marital status: Single   Tobacco Use    Smoking status: Every Day    Smokeless tobacco: Never   Vaping Use    Vaping Use: Never used   Substance and Sexual Activity    Alcohol use: Not Currently     Comment: Quit drinking 2 years ago    Drug use: No     Social Determinants of Health      Food Insecurity: No Food Insecurity (08/09/2022)    Hunger Vital Sign     Worried About Running Out of Food in the Last Year: Never true     Ran Out of Food in the Last Year: Never true   Transportation Needs: No Transportation Needs (08/09/2022)    PRAPARE - Armed forces logistics/support/administrative officer (Medical): No     Lack of Transportation (Non-Medical): No   Intimate Partner Violence: Not At Risk (08/09/2022)    Humiliation, Afraid, Rape, and Kick questionnaire     Fear of Current or Ex-Partner: No     Emotionally Abused: No     Physically Abused: No     Sexually Abused: No   Housing Stability: Unknown (08/09/2022)    Housing Stability Vital Sign     Unable to Pay for Housing in the Last Year: No     Unstable Housing in the Last Year: No       Family History:  History reviewed.  No pertinent family history.    Outpatient Medication:  Discharge Medication List as of 08/09/2022  7:01 PM        CONTINUE these medications which have NOT CHANGED    Details   allopurinol (ZYLOPRIM) 100 MG tablet TAKE 1 TABLET BY MOUTH ONCE DAILY FOR 90 DAYS, Historical Med      PARoxetine (PAXIL) 10 MG tablet Take 1 tablet (10 mg) by mouth every morning, Starting Fri 10/24/2021, Historical Med      ibuprofen (ADVIL) 600 MG tablet Take 1 tablet (600 mg total) by mouth every 6 (six) hours as needed for Pain, Starting Mon 01/16/2019, Print               REVIEW OF SYSTEMS   See History of Present Illness    PHYSICAL EXAM     ED Triage Vitals [08/09/22 1703]   Enc Vitals Group      BP (!) 148/93      Heart Rate 82      Resp Rate 18      Temp 98.3 F (36.8 C)      Temp Source Oral      SpO2 96 %      Weight 112 kg      Height 1.778 m      Head Circumference       Peak Flow       Pain Score 7      Pain Loc       Pain Edu?       Excl. in [place]?        Physical Exam  Vitals and nursing note reviewed.   Cardiovascular:      Rate and Rhythm: Normal rate and regular rhythm.   Pulmonary:      Effort: Pulmonary effort is normal. No respiratory distress.       Breath sounds: No wheezing.   Abdominal:      Palpations: Abdomen is soft.      Tenderness: There is no abdominal tenderness.   Musculoskeletal:      Cervical back: Normal range of motion.      Comments: No midline spine tenderness, RUE radial pulses intact, no focal tenderness, joints with FROM, RLE DP pulses intact, no focal tenderness, joints with FROM       Neurological:      Mental Status: He is alert.      Comments: CN II-XII intact, finger to nose normal, no pronator drift           HPI & MEDICAL DECISION MAKING       Chief Complaint: Dizziness and Shortness of Breath       PRIMARY PROBLEM LIST     Acute illness/injury with risk to life or bodily function (based on differential diagnosis or evaluation) Severe   Chronic Illness Impacting Care of the above problem: Obesity Increases complexity of evaluation and Increases the risk of severe disease  Differential Diagnosis in MDM below      DISCUSSION        Ddx: sciatica, cervical neuralgia, primary headache, don't suspect cauda equina/epidural abscess/sah/meningitis, don't suspect acs/PE (Low heart score (if negative troponin), perc negative, sob pain induced), plan for labwork, meds for dispo.          At discharge patient feels better. I have discussed all testing results and plan of care with patient. All questions solicited and addressed. Possibility of evolving illness reviewed. Patient agrees with going home, following up and returning if worsening  symptoms.          Vital Signs: Reviewed the patient's vital signs.   Nursing Notes: Reviewed and utilized available nursing notes.  Counseling: The emergency provider has spoken with the patient and discussed today's findings, in addition to providing specific details for the plan of care.  Questions are answered and there is agreement with the plan.        If patient is being hospitalized is severe sepsis or septic shock suspected?: N/A        External Records Reviewed?: [place] Prescription Drug Monitor  Reviewed, and no concerning prescriptions noted.  Additional Notes                    MIPS DOCUMENTATION                CARDIAC STUDIES    The following cardiac studies were independently interpreted by the Emergency Medicine Physician.  For full cardiac study results please see chart.    EKG:  Interpreted by the EP.   Time Interpreted: 1800   Rate: 80   Interpretation: NSR, RAD, RBBB, no ST abnoramlity, TWI of v2   Comparison: RBBB new from [facility]      XR Chest  AP Portable   Final Result    No acute disease.       Elwanda Brooklyn, MD   08/09/2022 6:52 PM          EMERGENCY DEPT. MEDICATIONS      ED Medication Orders (From admission, onward)      Start Ordered     Status Ordering Provider    08/09/22 1735 08/09/22 1734  sodium chloride 0.9 % bolus 1,000 mL  Once        Route: Intravenous  Ordered Dose: 1,000 mL       Last MAR action: Stopped Francenia Chimenti    08/09/22 1734 08/09/22 1733  metoclopramide (REGLAN) injection 10 mg  Once        Route: Intravenous  Ordered Dose: 10 mg       Last MAR action: Given Staci Dack    08/09/22 1734 08/09/22 1733  famotidine (PEPCID) injection 20 mg  Once        Route: Intravenous  Ordered Dose: 20 mg       Last MAR action: Given Navarre Diana    08/09/22 1734 08/09/22 1733  ketorolac (TORADOL) injection 15 mg  Once        Route: Intravenous  Ordered Dose: 15 mg       Last MAR action: Given Alexzandria Massman    08/09/22 1734 08/09/22 1733    Once        Route: Oral  Ordered Dose: 10 mg       Discontinued Natalina Wieting    08/09/22 1733 08/09/22 1733  lidocaine (LIDODERM) 5 % 1 patch  Once        Route: Transdermal  Ordered Dose: 1 patch       Last MAR action: Patch Applied Josiah Wojtaszek            LABORATORY RESULTS    Ordered and independently interpreted AVAILABLE laboratory tests.   Results       Procedure Component Value Units Date/Time    CBC and differential [id number]  (Abnormal) Collected: 08/09/22 1727    Specimen: Blood Updated: 08/09/22 1801     WBC  9.11 x10 3/uL      Hgb 15.5 g/dL  Hematocrit 44.8 %      Platelets 249 x10 3/uL      RBC 5.24 x10 6/uL      MCV 85.5 fL      MCH 29.6 pg      MCHC 34.6 g/dL      RDW 13 %      MPV 9.1 fL      Instrument Absolute Neutrophil Count 4.33 x10 3/uL      Neutrophils 47.6 %      Lymphocytes Automated 40.7 %      Monocytes 9.2 %      Eosinophils Automated 1.8 %      Basophils Automated 0.5 %      Immature Granulocytes 0.2 %      Nucleated RBC 0.0 /100 WBC      Neutrophils Absolute 4.33 x10 3/uL      Lymphocytes Absolute Automated 3.71 x10 3/uL      Monocytes Absolute Automated 0.84 x10 3/uL      Eosinophils Absolute Automated 0.16 x10 3/uL      Basophils Absolute Automated 0.05 x10 3/uL      Immature Granulocytes Absolute 0.02 x10 3/uL      Absolute NRBC 0.00 x10 3/uL     High Sensitivity Troponin-I at 0 hrs [id number] Collected: 08/09/22 1727    Specimen: Blood Updated: 08/09/22 1800     hs Troponin-I <2.7 ng/L     Comprehensive metabolic panel [id number]  (Abnormal) Collected: 08/09/22 1727    Specimen: Blood Updated: 08/09/22 1754     Glucose 104 mg/dL      BUN 8.0 mg/dL      Creatinine 0.9 mg/dL      Sodium 138 mEq/L      Potassium 3.9 mEq/L      Chloride 107 mEq/L      CO2 22 mEq/L      Calcium 9.2 mg/dL      Protein, Total 7.7 g/dL      Albumin 4.1 g/dL      AST (SGOT) 37 U/L      ALT 63 U/L      Alkaline Phosphatase 75 U/L      Bilirubin, Total 0.9 mg/dL      Globulin 3.6 g/dL      Albumin/Globulin Ratio 1.1     Anion Gap 9.0     eGFR >60.0 mL/min/1.73 m2               CRITICAL CARE/PROCEDURES    Procedures    DIAGNOSIS      Diagnosis:  Final diagnoses:   Cervical neuralgia   Sciatica of right side       Disposition:  ED Disposition       ED Disposition   Discharge    Condition   --    Date/Time   Sun Aug 09, 2022  7:01 PM    Comment   Jaland Burdette discharge to home/self care.    Condition at disposition: Stable                 Prescriptions:  Discharge Medication List as of 08/09/2022  7:01 PM        START taking  these medications    Details   cyclobenzaprine (FLEXERIL) 10 MG tablet Take 1 tablet (10 mg) by mouth 3 (three) times daily as needed for Muscle spasms, Starting Sun 08/09/2022, Until Mon 08/24/2022 at 2359, E-Rx      famotidine (PEPCID) 20 MG tablet Take 1 tablet (20 mg)  by mouth 2 (two) times daily for 15 days, Starting Sun 08/09/2022, Until Mon 08/24/2022, E-Rx      ondansetron (ZOFRAN-ODT) 4 MG disintegrating tablet Take 1 tablet (4 mg) by mouth every 6 (six) hours as needed for Nausea, Starting Sun 08/09/2022, E-Rx           CONTINUE these medications which have NOT CHANGED    Details   allopurinol (ZYLOPRIM) 100 MG tablet TAKE 1 TABLET BY MOUTH ONCE DAILY FOR 90 DAYS, Historical Med      PARoxetine (PAXIL) 10 MG tablet Take 1 tablet (10 mg) by mouth every morning, Starting Fri 10/24/2021, Historical Med      ibuprofen (ADVIL) 600 MG tablet Take 1 tablet (600 mg total) by mouth every 6 (six) hours as needed for Pain, Starting Mon 01/16/2019, Print             This note was generated by the Epic EMR system/ Dragon speech recognition and may contain inherent errors or omissions not intended by the user. Grammatical errors, random word insertions, deletions and pronoun errors  are occasional consequences of this technology due to software limitations. Not all errors are caught or corrected. If there are questions or concerns about the content of this note or information contained within the body of this dictation they should be addressed directly with the author for clarification.       Lanier Prude, MD  08/09/22 2030

## 2022-08-09 NOTE — Discharge Instructions (Addendum)
Pain Control Plan:    1. Ibuprofen (Motrin, Advil) 400mg up to every 6 hours.  (Do not take ibuprofen if you have kidney disease, gastritis, or peptic ulcer disease without specifically discussing with your doctor.)     AND/OR     2. Tylenol 500mg, up to every 4-6 hours. (Do not take tylenol if you have liver disease such as hepatitis, cirrhosis, or if you are an alcoholic. )    You CAN take both of these medications together. It will provide additional pain relief. Another option is you can alternate medicine every 3 hours (eg. Tylenol 1pm, Ibuprofen 4pm, Tylenol 7pm, etc.). Be careful to not consume more than 4,000mg of Tylenol in a 24 hour period, as this can be harmful to your liver.     3. Muscle relaxer: Take this additional medication to help your muscles relax. It will make you drowsy and also help you get sleep at night. Do not drink or drive when taking these medications.    4. Lidocaine patch: Place every 24 hours for 12 hours as needed.     If you require consistent pain control in one week, please see your primary care doctor to re-evaluate your pain.

## 2022-08-10 LAB — ECG 12-LEAD
Atrial Rate: 80 {beats}/min
IHS MUSE NARRATIVE AND IMPRESSION: NORMAL
P Axis: 50 degrees
P-R Interval: 172 ms
Q-T Interval: 390 ms
QTC Calculation (Bezet): 449 ms
T Axis: 8 degrees
Ventricular Rate: 80 {beats}/min

## 2022-08-13 ENCOUNTER — Telehealth: Payer: Self-pay | Admitting: Family Medicine

## 2022-08-13 ENCOUNTER — Ambulatory Visit (INDEPENDENT_AMBULATORY_CARE_PROVIDER_SITE_OTHER): Payer: BC Managed Care – PPO | Admitting: Family Medicine

## 2022-08-13 ENCOUNTER — Encounter (INDEPENDENT_AMBULATORY_CARE_PROVIDER_SITE_OTHER): Payer: Self-pay | Admitting: Family Medicine

## 2022-08-13 VITALS — BP 118/81 | HR 84 | Temp 97.7°F | Resp 16 | Ht 71.0 in | Wt 245.4 lb

## 2022-08-13 DIAGNOSIS — R5383 Other fatigue: Secondary | ICD-10-CM

## 2022-08-13 DIAGNOSIS — I2510 Atherosclerotic heart disease of native coronary artery without angina pectoris: Secondary | ICD-10-CM | POA: Insufficient documentation

## 2022-08-13 DIAGNOSIS — L0291 Cutaneous abscess, unspecified: Secondary | ICD-10-CM

## 2022-08-13 DIAGNOSIS — M1A0790 Idiopathic chronic gout, unspecified ankle and foot, without tophus (tophi): Secondary | ICD-10-CM

## 2022-08-13 DIAGNOSIS — M109 Gout, unspecified: Secondary | ICD-10-CM | POA: Insufficient documentation

## 2022-08-13 DIAGNOSIS — Z8674 Personal history of sudden cardiac arrest: Secondary | ICD-10-CM | POA: Insufficient documentation

## 2022-08-13 DIAGNOSIS — E669 Obesity, unspecified: Secondary | ICD-10-CM | POA: Insufficient documentation

## 2022-08-13 DIAGNOSIS — Z0000 Encounter for general adult medical examination without abnormal findings: Secondary | ICD-10-CM

## 2022-08-13 LAB — HEPATIC FUNCTION PANEL
ALT: 86 U/L — ABNORMAL HIGH (ref 0–55)
AST (SGOT): 55 U/L — ABNORMAL HIGH (ref 5–41)
Albumin/Globulin Ratio: 1.2 (ref 0.9–2.2)
Albumin: 4.4 g/dL (ref 3.5–5.0)
Alkaline Phosphatase: 79 U/L (ref 37–117)
Bilirubin Direct: 0.3 mg/dL (ref 0.0–0.5)
Bilirubin Indirect: 0.9 mg/dL (ref 0.2–1.0)
Bilirubin, Total: 1.2 mg/dL (ref 0.2–1.2)
Globulin: 3.7 g/dL — ABNORMAL HIGH (ref 2.0–3.6)
Protein, Total: 8.1 g/dL (ref 6.0–8.3)

## 2022-08-13 LAB — HEMOLYSIS INDEX: Hemolysis Index: 9 Index (ref 0–24)

## 2022-08-13 LAB — HEPATITIS C ANTIBODY: Hepatitis C, AB: UNDETERMINED — AB

## 2022-08-13 LAB — URIC ACID: Uric acid: 6.7 mg/dL (ref 3.6–9.7)

## 2022-08-13 LAB — HEMOGLOBIN A1C
Average Estimated Glucose: 139.9 mg/dL
Hemoglobin A1C: 6.5 % — ABNORMAL HIGH (ref 4.6–5.6)

## 2022-08-13 LAB — THYROID STIMULATING HORMONE (TSH), REFLEX ON ABNORMAL TO FREE T4, SERUM: TSH, Abn Reflex to Free T4, Serum: 1.66 u[IU]/mL (ref 0.35–4.94)

## 2022-08-13 LAB — LDL CHOLESTEROL, DIRECT: LDL CHOLESTEROL DIRECT: 105 mg/dL (ref 0–129)

## 2022-08-13 MED ORDER — DOXYCYCLINE HYCLATE 100 MG PO CAPS
100.00 mg | ORAL_CAPSULE | Freq: Two times a day (BID) | ORAL | 0 refills | Status: AC
Start: 2022-08-13 — End: 2022-08-20

## 2022-08-13 NOTE — Progress Notes (Signed)
ANNUAL PHYSICAL EXAM                                                                         HPI: Patient is a 42 y.o. male who presents today for a complete physical exam.    Current Concerns: 42 yo male for HA. Also with issues to be addressed States he is fatigued. Also with low back pain and b/l knee pain. Hx of depression on paroxetine. Takes allopurinol for gout prophylaxis.      Reported Health: fair health  Diet:  loves ice cream.   Exercise: none  Body mass index is 34.23 kg/m.    Dental: dentist visit > 1 year ago  Vision: glasses and eye exam > 1 year ago  Hearing: normal hearing    Sleep: does have trouble falling asleep or staying asleep, average of 8 hours nightly.  Depression Screen: Little interest or pleasure in doing things: 1 (08/13/2022  2:43 PM)  Feeling down, depressed, or hopeless: 1 (08/13/2022  2:43 PM)  Trouble falling or staying asleep, or sleeping too much: 0 (08/13/2022  2:43 PM)  Feeling tired or having little energy: 3 (08/13/2022  2:43 PM)  Poor appetite or overeating: 1 (08/13/2022  2:43 PM)  Feeling bad about yourself - or that you are a failure or have let yourself or your family down: 0 (08/13/2022  2:43 PM)  Trouble concentrating on things, such as reading the newspaper or watching television: 0 (08/13/2022  2:43 PM)  Moving or speaking so slowly that other people could have noticed. Or the opposite - being so fidgety or restless that you have been moving around a lot more than usual: 0 (08/13/2022  2:43 PM)  Thoughts that you would be better off dead, or of hurting yourself in some way: 0 (08/13/2022  2:43 PM)  PHQ Total Score: 6 (08/13/2022  2:43 PM)  If you checked off any problems, how difficult have these problems made it for you to do your work, take care of things at home, or get along with other people?: Somewhat difficult (08/13/2022  2:43 PM)        Social:  Lives with: sister. Safe and comfortable environment.   Work: used to work for [employer], now with [employer]  Tobacco: quit  6 months ago. Smoked 20 years. 1 ppd.   Alcohol: rare alcohol use. 3 drinks in 2.5 years.   Illicit Drug Use: No   - in past used cocaine.     Reproductive Health: not currently sexually active  Safety Elements Used: uses seat belts    Health Maintenance:  Immunization Status: immunizations up to date  Prior Screening Tests: no previous colorectal cancer screening  General Health Risks: no family history of prostate cancer, no family history of colon cancer, and family hx of depression.   Colonoscopy: na  Mammogram: na  Cervical Cancer Screening  na  PSA (If applicable): na    Past Medical History:   Diagnosis Date    Anxiety     Depression     Gout     Hyperlipidemia     Myocardial infarction     cardiac arrect-seizure-MI 2021       Past Surgical History:  Procedure Laterality Date    ABSCESS DRAINAGE      right neck    CYSTOSCOPY, URETEROSCOPY, LASER LITHOTRIPSY Left 11/29/2015    Procedure: CYSTOSCOPY, URETEROSCOPY, RETROGRADE PYELOGRAM  AND INSERTION OF LEFT DOUBLE  J STENT . ;  Surgeon: [name] Fava, MD;  Location: [facility];  Service: Urology;  Laterality: Left;  **IP-332-1/MD AVAIL 1600**    LITHOTRIPSY         History reviewed. No pertinent family history.    Allergies: Patient has no known allergies.    Medications:  Current Outpatient Medications   Medication Sig Dispense Refill    allopurinol (ZYLOPRIM) 100 MG tablet TAKE 1 TABLET BY MOUTH ONCE DAILY FOR 90 DAYS      cyclobenzaprine (FLEXERIL) 10 MG tablet Take 1 tablet (10 mg) by mouth 3 (three) times daily as needed for Muscle spasms 15 tablet 0    famotidine (PEPCID) 20 MG tablet Take 1 tablet (20 mg) by mouth 2 (two) times daily for 15 days 30 tablet 0    ibuprofen (ADVIL) 600 MG tablet Take 1 tablet (600 mg total) by mouth every 6 (six) hours as needed for Pain 30 tablet 0    ondansetron (ZOFRAN-ODT) 4 MG disintegrating tablet Take 1 tablet (4 mg) by mouth every 6 (six) hours as needed for Nausea 8 tablet 0    PARoxetine (PAXIL) 10 MG  tablet Take 1 tablet (10 mg) by mouth every morning       No current facility-administered medications for this visit.       Social History:  Patient  reports that he has quit smoking. His smoking use included cigarettes. He has never used smokeless tobacco. He reports that he does not currently use alcohol. He reports that he does not use drugs.      ROS:  ROS      Objective:   BP 118/81 (BP Site: Right arm, Patient Position: Sitting, Cuff Size: Large)   Pulse 84   Temp 97.7 F (36.5 C) (Temporal)   Resp 16   Ht 1.803 m (5\' 11" )   Wt 111.3 kg (245 lb 6.4 oz)   SpO2 94%   BMI 34.23 kg/m     Examination:   Physical Exam  Constitutional:       Appearance: Normal appearance. He is obese.   HENT:      Head: Normocephalic and atraumatic.      Right Ear: Tympanic membrane, ear canal and external ear normal.      Left Ear: Tympanic membrane, ear canal and external ear normal.      Mouth/Throat:      Mouth: Mucous membranes are moist.      Pharynx: No oropharyngeal exudate or posterior oropharyngeal erythema.   Eyes:      Extraocular Movements: Extraocular movements intact.      Pupils: Pupils are equal, round, and reactive to light.   Cardiovascular:      Rate and Rhythm: Normal rate and regular rhythm.      Heart sounds: No murmur heard.     No friction rub. No gallop.   Pulmonary:      Effort: Pulmonary effort is normal.      Breath sounds: Normal breath sounds. No wheezing, rhonchi or rales.   Abdominal:      General: Abdomen is flat.      Palpations: Abdomen is soft.   Skin:     General: Skin is warm and dry.   Neurological:  General: No focal deficit present.      Mental Status: He is alert and oriented to person, place, and time.   Psychiatric:         Mood and Affect: Mood normal.               Assessment & Plan        1. Wellness examination    - Hemoglobin A1C  - Hepatitis C (HCV) antibody, Total  - TSH, Abn Reflex to Free T4, Serum  - LDL cholesterol, direct  - Hepatic function panel (LFT)  - Uric  acid    2. Coronary artery disease involving native coronary artery of native heart without angina pectoris  Lipitor 10 mg  LDL    3. Other fatigue  Cbc  tsh  - Ambulatory referral to Sleep Medicine; Future    4. Hx of cardiac arrest    - Ambulatory referral to Sleep Medicine; Future    5. Idiopathic chronic gout of foot without tophus, unspecified laterality      6. Obesity (BMI 30.0-34.9)      7. Abscess  On inner thighs, recurrent 2/2 skin rubbing   - doxycycline (VIBRAMYCIN) 100 MG capsule; Take 1 capsule (100 mg) by mouth 2 (two) times daily for 7 days  Dispense: 14 capsule; Refill: 0        Health Maintenance:   Recommend optimizing low carbohydrate diet efforts and obtaining at least 150 minutes of aerobic exercise per week. Recommend 20-25 grams of dietary fiber daily. Recommend drinking at least 60-80 ounces of water per day. Recommend optimizing low sodium diet measures ( less than 2 grams of sodium in the diet per day ).Vision screening is due. Dental Screening is due.      [name] Winchell H Alexsandria Kivett, DO

## 2022-08-13 NOTE — Telephone Encounter (Signed)
Patient is calling to check on the status of a prescription request for Meloxicam that he spoke to about with his provider at his appointment today.    Please advise and return call to patient with an update.    Phone number for patient - [phone]

## 2022-08-13 NOTE — Progress Notes (Signed)
Have you seen any specialists/other providers since your last visit with [place]?    Yes    Health Maintenance Due   Topic Date Due    DEPRESSION SCREENING  Never done    HEPATITIS C SCREENING  Never done    INFLUENZA VACCINE  12/23/2021

## 2022-08-13 NOTE — Patient Instructions (Signed)
Do not take any NSAIDS with your meloxicam 15 mg as it is in that category and is at maximum dosage.

## 2022-08-14 MED ORDER — MELOXICAM 15 MG PO TABS
15.0000 mg | ORAL_TABLET | Freq: Every day | ORAL | 1 refills | Status: DC
Start: 2022-08-14 — End: 2022-10-06

## 2022-08-14 NOTE — Telephone Encounter (Signed)
Pt again called for the prescription refill for   Meloxicam .    Please advise...    [phone] pt

## 2022-08-14 NOTE — Telephone Encounter (Signed)
Rx sent 

## 2022-08-24 ENCOUNTER — Telehealth (INDEPENDENT_AMBULATORY_CARE_PROVIDER_SITE_OTHER): Payer: Self-pay | Admitting: Family Medicine

## 2022-08-24 NOTE — Telephone Encounter (Signed)
Spoke to pt regarding lab results indicating diabetes, elevated liver enzymes and hepatitis C results is inconclusive.  Dr. Ricardo Jericho would like to see pt to go over treatment plan and follow up on lab testing.  Pt verbally acknowledge lab results and appointment was schedule for April 5th at 1430.

## 2022-08-24 NOTE — Telephone Encounter (Signed)
-----   Message from [employer], DO sent at 08/14/2022  8:30 AM EDT -----  Blood work shows diabetes, elevated liver enzymes and a hepatitis C results which is inconclusive. Please have patient schedule a follow up with me next week to go over treatment plan and follow up testing.

## 2022-08-28 ENCOUNTER — Ambulatory Visit (INDEPENDENT_AMBULATORY_CARE_PROVIDER_SITE_OTHER): Payer: BC Managed Care – PPO | Admitting: Family Medicine

## 2022-08-28 VITALS — BP 136/78 | HR 76 | Temp 97.3°F | Resp 14 | Ht 68.9 in | Wt 249.0 lb

## 2022-08-28 DIAGNOSIS — M1A0790 Idiopathic chronic gout, unspecified ankle and foot, without tophus (tophi): Secondary | ICD-10-CM

## 2022-08-28 DIAGNOSIS — R7401 Elevation of levels of liver transaminase levels: Secondary | ICD-10-CM

## 2022-08-28 DIAGNOSIS — E119 Type 2 diabetes mellitus without complications: Secondary | ICD-10-CM

## 2022-08-28 MED ORDER — METFORMIN HCL ER 500 MG PO TB24
500.00 mg | ORAL_TABLET | Freq: Every morning | ORAL | 3 refills | Status: AC
Start: 2022-08-28 — End: ?

## 2022-08-28 NOTE — Progress Notes (Signed)
Have you seen any specialists/other providers since your last visit with [place]?    No      Arm preference verified?   No    Health Maintenance Due   Topic Date Due    Statin Use  Never done

## 2022-08-28 NOTE — Progress Notes (Signed)
[place] [facility] OFFICE VISIT           [name] [name]  is a 42 y.o.  male.  HPI  42 yo male here to follow up on new diagnosis of DM2. A1c of 6.5% on last labs. He also had slight transaminitis. Of note sober for 2 years. He states he used walk a great deal at job at [employer] but he does not get as many steps.   Also has gout - symptoms well controlled  Of note has back pain states meloxicam works to some degree but pain could be better controlled.   Review of Systems     BP 136/78 (BP Site: Right arm, Patient Position: Sitting, Cuff Size: Large)   Pulse 76   Temp 97.3 F (36.3 C)   Resp 14   Ht 1.75 m (5' 8.9")   Wt 112.9 kg (249 lb)   SpO2 95%   BMI 36.88 kg/m    Physical Exam  Constitutional:       Appearance: Normal appearance. He is obese.   Cardiovascular:      Rate and Rhythm: Normal rate.   Neurological:      Mental Status: He is alert.   Psychiatric:         Mood and Affect: Mood normal.         Thought Content: Thought content normal.         1. Type 2 diabetes mellitus without complication, without long-term current use of insulin  Counselled on exercise and diet.   - metFORMIN (GLUCOPHAGE-XR) 500 MG 24 hr tablet; Take 1 tablet (500 mg) by mouth every morning with breakfast  Dispense: 90 tablet; Refill: 3  - Hemoglobin A1C; Future    2. Severe obesity    - Hepatic function panel (LFT); Future    3. Transaminitis  Counselled on decreasing fat intake  Hepatic function panel.   - Hepatitis C (HCV) antibody, Total; Future

## 2022-10-06 ENCOUNTER — Encounter (INDEPENDENT_AMBULATORY_CARE_PROVIDER_SITE_OTHER): Payer: Self-pay

## 2022-10-06 ENCOUNTER — Telehealth (INDEPENDENT_AMBULATORY_CARE_PROVIDER_SITE_OTHER): Payer: Self-pay | Admitting: Family Medicine

## 2022-10-06 ENCOUNTER — Encounter (INDEPENDENT_AMBULATORY_CARE_PROVIDER_SITE_OTHER): Payer: Self-pay | Admitting: Family Medicine

## 2022-10-06 DIAGNOSIS — M1A0790 Idiopathic chronic gout, unspecified ankle and foot, without tophus (tophi): Secondary | ICD-10-CM

## 2022-10-06 DIAGNOSIS — G8929 Other chronic pain: Secondary | ICD-10-CM

## 2022-10-06 DIAGNOSIS — F330 Major depressive disorder, recurrent, mild: Secondary | ICD-10-CM

## 2022-10-06 MED ORDER — IBUPROFEN 600 MG PO TABS
600.0000 mg | ORAL_TABLET | Freq: Four times a day (QID) | ORAL | 0 refills | Status: DC | PRN
Start: 2022-10-06 — End: 2022-10-12

## 2022-10-06 MED ORDER — ALLOPURINOL 100 MG PO TABS
ORAL_TABLET | ORAL | 0 refills | Status: DC
Start: 2022-10-06 — End: 2023-01-18

## 2022-10-06 MED ORDER — PAROXETINE HCL 10 MG PO TABS
10.00 mg | ORAL_TABLET | Freq: Every morning | ORAL | 0 refills | Status: AC
Start: 2022-10-06 — End: ?

## 2022-10-06 NOTE — Telephone Encounter (Signed)
I called the patient to schedule a sooner follow up appt. Patient has appt on 06/21 to follow up but PCP would like for him to come in ASAP to discuss problems that have been ongoing. 06/21 appt can be canceled once he schedules a new appt with Dr. Bonney Roussel. Sent Mychart message to inform that he needs to schedule appt and provider phone number to do so.

## 2022-10-06 NOTE — Telephone Encounter (Signed)
Name, strength, directions of requested refill(s):    allopurinol (ZYLOPRIM) 100 MG tablet     PARoxetine (PAXIL) 10 MG tablet     ibuprofen (ADVIL) 600 MG tablet     How much medication is remaining: Patient has 10 days of medications remaining.     Pharmacy to send refill to or patient to pick up rx from office (mark requested pharmacy in BOLD):      [facility] [address] [place] - [address]  [address] [place] [address]  Phone: [phone] Fax: [phone]        Please mark "X" next to the preferred call back number:    Mobile: [phone] (mobile) X   Home: [phone] (home)    Work: @WORKPHONE @        Medication refill request, see above. Thank you     Additional Notes: Patient is requesting that the Dr. Orvilla Cornwall a note for has job explaining that the patient deals with sciatica in his lower back. Patient states that he will come to the office to pick up the letter.     Please call patient once the letter is ready for pick up.     Next Visit: 6.21.24

## 2022-10-06 NOTE — Telephone Encounter (Signed)
Patient wanting refill of paroxetine, allopurinol and motrin.     Also wanting note about back pain.     Note is written.

## 2022-10-06 NOTE — Telephone Encounter (Signed)
Pt is experiencing cramps in both legs and believes it is caused by his rx   metFORMIN (GLUCOPHAGE-XR) 500 MG 24 hr tablet. Pt declined scheduling an OV. Requesting a call back.    Ph. [phone]

## 2022-10-06 NOTE — Telephone Encounter (Signed)
Called patient to get more information on leg pain and medication concerns with Metformin, Patient shared the difficulties he has had over the past couple days with gout and medications. Leg cramps that have been interfering with his performance at work. Told patient I will relay information to Dr. Bonney Roussel and reach back out with a solution.

## 2022-10-08 ENCOUNTER — Telehealth: Payer: Self-pay | Admitting: Family Medicine

## 2022-10-08 MED ORDER — ATORVASTATIN CALCIUM 10 MG PO TABS
10.0000 mg | ORAL_TABLET | Freq: Every day | ORAL | 3 refills | Status: AC
Start: 2022-10-08 — End: ?

## 2022-10-08 NOTE — Telephone Encounter (Signed)
Pt is requesting Lipitor 10 MG medication.    Pt's call back number [phone]       [facility] [address] [place] - [address]  [address] [place] [address]  Phone: [phone] Fax: [phone]      Please advise.

## 2022-10-12 ENCOUNTER — Encounter (INDEPENDENT_AMBULATORY_CARE_PROVIDER_SITE_OTHER): Payer: Self-pay | Admitting: Family Medicine

## 2022-10-12 ENCOUNTER — Ambulatory Visit (INDEPENDENT_AMBULATORY_CARE_PROVIDER_SITE_OTHER): Payer: BC Managed Care – PPO | Admitting: Family Medicine

## 2022-10-12 ENCOUNTER — Telehealth (INDEPENDENT_AMBULATORY_CARE_PROVIDER_SITE_OTHER): Payer: Self-pay | Admitting: Family Medicine

## 2022-10-12 VITALS — BP 114/72 | HR 76 | Temp 96.2°F | Resp 16 | Ht 69.0 in | Wt 247.0 lb

## 2022-10-12 DIAGNOSIS — R252 Cramp and spasm: Secondary | ICD-10-CM

## 2022-10-12 DIAGNOSIS — E669 Obesity, unspecified: Secondary | ICD-10-CM

## 2022-10-12 DIAGNOSIS — M25561 Pain in right knee: Secondary | ICD-10-CM

## 2022-10-12 DIAGNOSIS — M5442 Lumbago with sciatica, left side: Secondary | ICD-10-CM

## 2022-10-12 DIAGNOSIS — E119 Type 2 diabetes mellitus without complications: Secondary | ICD-10-CM | POA: Insufficient documentation

## 2022-10-12 DIAGNOSIS — M5431 Sciatica, right side: Secondary | ICD-10-CM

## 2022-10-12 DIAGNOSIS — G8929 Other chronic pain: Secondary | ICD-10-CM

## 2022-10-12 DIAGNOSIS — M5441 Lumbago with sciatica, right side: Secondary | ICD-10-CM

## 2022-10-12 DIAGNOSIS — M25511 Pain in right shoulder: Secondary | ICD-10-CM

## 2022-10-12 LAB — POCT GLUCOSE: Whole Blood Glucose POCT: 112 mg/dL — AB (ref 70–100)

## 2022-10-12 MED ORDER — IBUPROFEN 600 MG PO TABS
600.0000 mg | ORAL_TABLET | Freq: Four times a day (QID) | ORAL | 3 refills | Status: DC | PRN
Start: 2022-10-12 — End: 2024-04-10

## 2022-10-12 MED ORDER — IBUPROFEN 600 MG PO TABS
600.0000 mg | ORAL_TABLET | Freq: Four times a day (QID) | ORAL | 3 refills | Status: DC | PRN
Start: 2022-10-12 — End: 2022-10-12

## 2022-10-12 NOTE — Telephone Encounter (Signed)
Patient sent a mychart message asking to be seen today. Reached out to patient but did not get no answer. Left vm explaining that he is able to be seen today and to give [place] a call back so that way we can get him scheduled.

## 2022-10-12 NOTE — Progress Notes (Signed)
Have you seen any specialists/other providers since your last visit with [place]?    No    Arm preference verified?   No        Health Maintenance Due   Topic Date Due    OPHTHALMOLOGY EXAM  Never done    URINE MICROALBUMIN  Never done    HIGH RISK PNEUMONIA  Never done

## 2022-10-12 NOTE — Progress Notes (Addendum)
[facility] VISIT           [name] [name]  is a 42 y.o.  male.  HPI  42 yo male with worsening R shoulder, R knee and low back pain with b/l radiculopathy. He also reports cramping of LE. He states he has R shoulder and low back pain chronically but it has worsened to the point where it has become difficult to take the bus and train to work about once a week.   Chief Complaint   Patient presents with    Knee Pain    Nerve      Sciatic nerve pain     BS - 112    Review of Systems     BP 114/72 (BP Site: Right arm, Patient Position: Sitting, Cuff Size: Medium)   Pulse 76   Temp (!) 96.2 F (35.7 C)   Resp 16   Ht 1.753 m (5\' 9" )   Wt 112 kg (247 lb)   SpO2 98%   BMI 36.48 kg/m    Physical Exam  Constitutional:       Appearance: Normal appearance. He is obese.   Musculoskeletal:      Right knee: Normal.      Comments: + straight leg test on Left side at 60 degrees  Neg empty can and apprehension test at b/l shoulders     Neurological:      General: No focal deficit present.      Mental Status: He is alert and oriented to person, place, and time. Mental status is at baseline.   Psychiatric:         Mood and Affect: Mood normal.         Behavior: Behavior normal.         Thought Content: Thought content normal.         Judgment: Judgment normal.     Over 40 minutes spent on review, exam, discusssion and plan formulation with patient.    1. Type 2 diabetes mellitus without complication, without long-term current use of insulin    - POCT glucose  - Basic Metabolic Panel    2. Chronic pain of right knee    - XR Knee 3 View Right; Future  - ibuprofen (ADVIL) 600 MG tablet; Take 1 tablet (600 mg) by mouth every 6 (six) hours as needed for Pain  Dispense: 90 tablet; Refill: 3    3. Chronic right shoulder pain    - XR Shoulder Right 2+ Views; Future  - ibuprofen (ADVIL) 600 MG tablet; Take 1 tablet (600 mg) by mouth every 6 (six) hours as needed for Pain  Dispense: 90 tablet; Refill: 3    4. Bilateral  sciatica    - X-ray Lumbar Spine AP and Lateral; Future  - Referral to Orthopedic Surgery ([place]); Future    5. Chronic low back pain with sciatica, sciatica laterality unspecified, unspecified back pain laterality    - ibuprofen (ADVIL) 600 MG tablet; Take 1 tablet (600 mg) by mouth every 6 (six) hours as needed for Pain  Dispense: 90 tablet; Refill: 3    6. Leg cramp    - Calcium, ionized

## 2022-10-13 ENCOUNTER — Ambulatory Visit
Admission: RE | Admit: 2022-10-13 | Discharge: 2022-10-13 | Disposition: A | Discharge status: Home / Self Care | Payer: BC Managed Care – PPO | Source: Ambulatory Visit | Attending: Family Medicine | Admitting: Family Medicine

## 2022-10-13 ENCOUNTER — Telehealth (INDEPENDENT_AMBULATORY_CARE_PROVIDER_SITE_OTHER): Payer: Self-pay | Admitting: Family Medicine

## 2022-10-13 ENCOUNTER — Other Ambulatory Visit (INDEPENDENT_AMBULATORY_CARE_PROVIDER_SITE_OTHER): Payer: Self-pay | Admitting: Family Medicine

## 2022-10-13 DIAGNOSIS — M5432 Sciatica, left side: Secondary | ICD-10-CM

## 2022-10-13 DIAGNOSIS — M25511 Pain in right shoulder: Secondary | ICD-10-CM | POA: Insufficient documentation

## 2022-10-13 DIAGNOSIS — R252 Cramp and spasm: Secondary | ICD-10-CM

## 2022-10-13 DIAGNOSIS — M5431 Sciatica, right side: Secondary | ICD-10-CM | POA: Insufficient documentation

## 2022-10-13 DIAGNOSIS — E119 Type 2 diabetes mellitus without complications: Secondary | ICD-10-CM

## 2022-10-13 DIAGNOSIS — E669 Obesity, unspecified: Secondary | ICD-10-CM

## 2022-10-13 DIAGNOSIS — M25561 Pain in right knee: Secondary | ICD-10-CM

## 2022-10-13 DIAGNOSIS — G8929 Other chronic pain: Secondary | ICD-10-CM | POA: Insufficient documentation

## 2022-10-13 LAB — CALCIUM, IONIZED: Calcium, Ionized: 2.61 mEq/L — ABNORMAL HIGH (ref 2.30–2.58)

## 2022-10-13 LAB — BASIC METABOLIC PANEL
Anion Gap: 10 (ref 5.0–15.0)
BUN: 13 mg/dL (ref 9.0–28.0)
CO2: 23 mEq/L (ref 17–29)
Calcium: 9.8 mg/dL (ref 8.5–10.5)
Chloride: 104 mEq/L (ref 99–111)
Creatinine: 1 mg/dL (ref 0.5–1.5)
Glucose: 96 mg/dL (ref 70–100)
Potassium: 4.1 mEq/L (ref 3.5–5.3)
Sodium: 137 mEq/L (ref 135–145)
eGFR: >60 mL/min/{1.73_m2} (ref >=60)

## 2022-10-13 LAB — HEMOLYSIS INDEX(SOFT): Hemolysis Index: 7 Index (ref 0–24)

## 2022-10-13 NOTE — Telephone Encounter (Signed)
Pt called to notify PCP that he had his imaging done today. Please advise if necessary, thank you.    Phone: [phone]

## 2022-10-14 ENCOUNTER — Ambulatory Visit (INDEPENDENT_AMBULATORY_CARE_PROVIDER_SITE_OTHER): Payer: BC Managed Care – PPO | Admitting: Family Medicine

## 2022-10-22 ENCOUNTER — Telehealth: Payer: Self-pay | Admitting: Family Medicine

## 2022-10-22 NOTE — Telephone Encounter (Signed)
Patient is requesting a call back regarding paperwork he needs filled out for his employer.    Please advise - patient was advised to schedule an appointment but declined.    Phone number for patient - [phone]

## 2022-10-23 NOTE — Telephone Encounter (Signed)
Patient called and states he needs forms stating he needs to work from home at times. I went over diagnosis with patient and stated there was not a severe diagnosis here that would likely support that. He stated he had lived with a great deal of pain from gout and other issues in his life and was dealing with vertigo and other issues and that if I didn't believe him "that was on me". I stated he could bring in the forms and I would go through them with him but there would be no guarantee they would be filled out in a certain way. He restated that this is how he felt and if I didn't believe him "that was on me" and that he could go elsewhere. I restated I would go through the forms he had in person in office and could not guarantee anything beyond that.

## 2022-11-09 ENCOUNTER — Ambulatory Visit (INDEPENDENT_AMBULATORY_CARE_PROVIDER_SITE_OTHER): Payer: BC Managed Care – PPO | Admitting: Physician Assistant

## 2022-11-13 ENCOUNTER — Ambulatory Visit (INDEPENDENT_AMBULATORY_CARE_PROVIDER_SITE_OTHER): Payer: BC Managed Care – PPO | Admitting: Family Medicine

## 2022-11-23 ENCOUNTER — Telehealth (INDEPENDENT_AMBULATORY_CARE_PROVIDER_SITE_OTHER): Payer: Self-pay | Admitting: Physician Assistant

## 2022-11-23 NOTE — Telephone Encounter (Signed)
Sent email to reschedule Orthopedic appointment with Sharin Grave, PA

## 2022-11-30 ENCOUNTER — Ambulatory Visit (INDEPENDENT_AMBULATORY_CARE_PROVIDER_SITE_OTHER): Payer: BC Managed Care – PPO | Admitting: Physician Assistant

## 2022-12-11 ENCOUNTER — Encounter (INDEPENDENT_AMBULATORY_CARE_PROVIDER_SITE_OTHER): Payer: Self-pay

## 2022-12-29 ENCOUNTER — Ambulatory Visit (INDEPENDENT_AMBULATORY_CARE_PROVIDER_SITE_OTHER): Payer: BC Managed Care – PPO | Admitting: Family Medicine

## 2022-12-29 ENCOUNTER — Encounter (INDEPENDENT_AMBULATORY_CARE_PROVIDER_SITE_OTHER): Payer: Self-pay | Admitting: Family Medicine

## 2022-12-29 VITALS — Ht 71.0 in

## 2022-12-29 DIAGNOSIS — M5416 Radiculopathy, lumbar region: Secondary | ICD-10-CM

## 2022-12-29 MED ORDER — METHYLPREDNISOLONE 4 MG PO TBPK
ORAL_TABLET | ORAL | 0 refills | Status: AC
Start: 2022-12-29 — End: 2023-01-04

## 2022-12-29 NOTE — Progress Notes (Addendum)
[facility]                  Date of Exam: 12/29/2022 9:28 PM      Patient ID: [name] [name] is a 42 y.o. male.  Attending Physician: Hanley Ben, DO      Chief Complaint:   Chief Complaint   Patient presents with    Back Problem     Lower back pain             HPI:   HPI  Comes in today for evaluation of chronic lower back pain.  Tends to happen with prolonged standing and walking.  No injury.  He would take frequent nsaids which would help.  He currently works at [employer] and has to commute to work which flares up his pain.  Notes a few months ago the pain worsened and he started having elecrtical pain that goes down the legs.       ROS:   As per HPI.  Otherwise as below.  Review of Systems   Constitutional:  Negative for activity change, appetite change, chills, fatigue and fever.   HENT:  Negative for congestion and postnasal drip.    Respiratory:  Negative for shortness of breath and wheezing.    Cardiovascular:  Negative for chest pain and palpitations.   Gastrointestinal:  Negative for abdominal distention and abdominal pain.   Genitourinary:  Negative for difficulty urinating and dysuria.   Musculoskeletal:  Negative for arthralgias and back pain.   Neurological:  Negative for dizziness and numbness.           Problem List:   Problem List[1]     Current Meds:   Medications Taking[2]      Allergies:   Allergies[3]     Past Surgical History:   Past Surgical History:   Procedure Laterality Date    ABSCESS DRAINAGE      right neck    CYSTOSCOPY, URETEROSCOPY, LASER LITHOTRIPSY Left 11/29/2015    Procedure: CYSTOSCOPY, URETEROSCOPY, RETROGRADE PYELOGRAM  AND INSERTION OF LEFT DOUBLE  J STENT . ;  Surgeon: Prescilla Sours, MD;  Location: [facility];  Service: Urology;  Laterality: Left;  **IP-332-1/MD AVAIL 1600**    LITHOTRIPSY            Family History:   No family history on file.       Social History:   Social History[4]       The following sections were  reviewed this encounter by the provider:             Vital Signs:   Ht 1.803 m (5\' 11" )   BMI 34.45 kg/m           Physical Exam:   Physical Exam  Vitals and nursing note reviewed.   Constitutional:       General: He is not in acute distress.     Appearance: Normal appearance. He is not ill-appearing.   HENT:      Head: Normocephalic and atraumatic.      Right Ear: External ear normal.      Left Ear: External ear normal.      Nose: Nose normal. No congestion.      Mouth/Throat:      Mouth: Mucous membranes are moist.   Eyes:      Pupils: Pupils are equal, round, and reactive to light.   Musculoskeletal:      Comments: + slump b/l  Neurological:      Mental Status: He is alert.   Psychiatric:         Mood and Affect: Mood normal.               Procedure(s):   Procedures        Radiology:            Assessment:   1. Lumbar radiculopathy  - MRI lumbar spine without contrast  - Ambulatory referral to Physical Therapy; Future  - Ambulatory referral to Pain Clinic; Future  - methylPREDNISolone (MEDROL DOSEPAK) 4 MG tablet; Follow Package Directions.  Dispense: 21 tablet; Refill: 0           Plan:   1. Lumbar radiculopathy       Treatment options discussed with patient at length, including risks, benefits, alternatives, and the nature of any potential procedures for the problem. Medrol dosepack and PT.  If symptoms persist recommend that he obtain and MRI of his lumbar spine and follow-up with pain management for potential cortisone injections         Follow-up:   No follow-ups on file.          Lonell Face II, DO                [1]   Patient Active Problem List  Diagnosis    Nephrolithiasis    S/P cystoscopy    Gout    Hx of cardiac arrest    Obesity (BMI 30.0-34.9)    Severe obesity    Type 2 diabetes mellitus without complication, without long-term current use of insulin   [2]   Outpatient Medications Marked as Taking for the 12/29/22 encounter (Office Visit) with Hanley Ben, DO   Medication Sig Dispense  Refill    allopurinol (ZYLOPRIM) 100 MG tablet TAKE 1 TABLET BY MOUTH ONCE DAILY FOR 90 DAYS 60 tablet 0    atorvastatin (LIPITOR) 10 MG tablet Take 1 tablet (10 mg) by mouth daily 90 tablet 3    metFORMIN (GLUCOPHAGE-XR) 500 MG 24 hr tablet Take 1 tablet (500 mg) by mouth every morning with breakfast 90 tablet 3    ondansetron (ZOFRAN-ODT) 4 MG disintegrating tablet Take 1 tablet (4 mg) by mouth every 6 (six) hours as needed for Nausea 8 tablet 0    PARoxetine (PAXIL) 10 MG tablet Take 1 tablet (10 mg) by mouth every morning 60 tablet 0   [3] No Known Allergies  [4]   Social History  Tobacco Use    Smoking status: Former     Types: Cigarettes    Smokeless tobacco: Never   Vaping Use    Vaping status: Never Used   Substance Use Topics    Alcohol use: Not Currently     Comment: Quit drinking 2 years ago    Drug use: No

## 2023-01-04 ENCOUNTER — Encounter (INDEPENDENT_AMBULATORY_CARE_PROVIDER_SITE_OTHER): Payer: Self-pay | Admitting: Family Medicine

## 2023-01-04 ENCOUNTER — Encounter (INDEPENDENT_AMBULATORY_CARE_PROVIDER_SITE_OTHER): Payer: Self-pay

## 2023-01-11 ENCOUNTER — Other Ambulatory Visit (INDEPENDENT_AMBULATORY_CARE_PROVIDER_SITE_OTHER): Payer: Self-pay | Admitting: Family Medicine

## 2023-01-11 DIAGNOSIS — M5416 Radiculopathy, lumbar region: Secondary | ICD-10-CM

## 2023-01-18 ENCOUNTER — Other Ambulatory Visit (INDEPENDENT_AMBULATORY_CARE_PROVIDER_SITE_OTHER): Payer: Self-pay | Admitting: Family Medicine

## 2023-01-18 DIAGNOSIS — M1A0790 Idiopathic chronic gout, unspecified ankle and foot, without tophus (tophi): Secondary | ICD-10-CM

## 2023-02-05 ENCOUNTER — Ambulatory Visit
Admission: RE | Admit: 2023-02-05 | Discharge: 2023-02-05 | Disposition: A | Discharge status: Home / Self Care | Payer: BC Managed Care – PPO | Source: Ambulatory Visit | Attending: Family Medicine | Admitting: Family Medicine

## 2023-02-05 DIAGNOSIS — M5416 Radiculopathy, lumbar region: Secondary | ICD-10-CM | POA: Insufficient documentation

## 2023-02-23 ENCOUNTER — Encounter (INDEPENDENT_AMBULATORY_CARE_PROVIDER_SITE_OTHER): Payer: Self-pay | Admitting: Family Medicine

## 2023-02-23 ENCOUNTER — Ambulatory Visit (INDEPENDENT_AMBULATORY_CARE_PROVIDER_SITE_OTHER): Payer: BC Managed Care – PPO | Admitting: Family Medicine

## 2023-02-23 VITALS — BP 114/78 | HR 83

## 2023-02-23 DIAGNOSIS — M48062 Spinal stenosis, lumbar region with neurogenic claudication: Secondary | ICD-10-CM

## 2023-02-23 NOTE — Progress Notes (Signed)
[facility]                  Date of Exam: 02/23/2023 4:56 PM      Patient ID: [name] [name] is a 42 y.o. male.  Attending Physician: Hanley Ben, DO      Chief Complaint:   Chief Complaint   Patient presents with    Back Pain            HPI:   HPI  Following up for MRI review for L spine. Reports no significant changes since last seen. Continued pain and reports some episodes of back locking. Is still having radiation to posterior legs and numbness in feet which is intermittent. Has been taking Tylenol daily with some relief. Has not yet started PT or established with spine specialist, was awaiting MRI review. Reports limitations to ADLs and ability to work as it does involve prolonged sitting/standing and driving.  No bowel of bladder dysfunction.       ROS:   As per HPI.  Otherwise as below.  Review of Systems   Constitutional:  Negative for activity change, appetite change, chills, fatigue and fever.   HENT:  Negative for congestion and postnasal drip.    Respiratory:  Negative for shortness of breath and wheezing.    Cardiovascular:  Negative for chest pain and palpitations.   Gastrointestinal:  Negative for abdominal distention and abdominal pain.   Genitourinary:  Negative for difficulty urinating and dysuria.   Musculoskeletal:  Negative for arthralgias and back pain.   Neurological:  Negative for dizziness and numbness.           Problem List:   Problem List[1]     Current Meds:   Medications Taking[2]      Allergies:   Allergies[3]     Past Surgical History:   Past Surgical History[4]       Family History:   Family History[5]       Social History:   Social History[6]       The following sections were reviewed this encounter by the provider:   Tobacco  Allergies  Meds  Problems  Med Hx  Surg Hx  Fam Hx              Vital Signs:   BP 114/78   Pulse 83   SpO2 98%           Physical Exam:   Physical Exam  Vitals and nursing note reviewed.   Constitutional:        General: [name] [name] is not in acute distress.     Appearance: Normal appearance. [name] [name] is not ill-appearing.   HENT:      Head: Normocephalic and atraumatic.      Right Ear: External ear normal.      Left Ear: External ear normal.      Nose: Nose normal. No congestion.      Mouth/Throat:      Mouth: Mucous membranes are moist.   Eyes:      Pupils: Pupils are equal, round, and reactive to light.   Musculoskeletal:      Comments: 5/5 b/l lower extremity strength.  Positive slump and SLR b/l   Neurological:      Mental Status: [name] [name] is alert.   Psychiatric:         Mood and Affect: Mood normal.               Procedure(s):  Procedures        Radiology:   MRI of the L spine dated 9/16 reviewed with the patient.  Radiology report below    IMPRESSION:      1.Degenerative changes at the L4-L5 level resulting in marked spinal canal  narrowing, narrowing of the lateral recesses, moderate narrowing of the  left neural foramen, mild to moderate narrowing of the right neural  foramen.  2.Degenerative changes at the L2-L3 level resulting in mild to moderate  narrowing of the left neural foramen, mild narrowing of the right neural  foramen, mild spinal canal narrowing.     Terrilee Croak, MD  02/08/2023 1:14 PM         Assessment:   1. Spinal stenosis of lumbar region with neurogenic claudication  - Referral to [facility]; Future           Plan:   1. Spinal stenosis of lumbar region with neurogenic claudication       After discussion and MRI review will refer [name] [name] to spine surgery for further evaluation and management of severe lumbar stenosis         Follow-up:   No follow-ups on file.          [name] Face II, DO              [1]   Patient Active Problem List  Diagnosis    Nephrolithiasis    S/P cystoscopy    Gout    Hx of cardiac arrest    Obesity (BMI 30.0-34.9)    Severe obesity    Type 2 diabetes mellitus without complication, without long-term current use of insulin   [2]   Outpatient Medications Marked as Taking for  the 02/23/23 encounter (Office Visit) with Hanley Ben, DO   Medication Sig Dispense Refill    allopurinol (ZYLOPRIM) 100 MG tablet Take 1 tablet by mouth once daily 90 tablet 0    atorvastatin (LIPITOR) 10 MG tablet Take 1 tablet (10 mg) by mouth daily 90 tablet 3    metFORMIN (GLUCOPHAGE-XR) 500 MG 24 hr tablet Take 1 tablet (500 mg) by mouth every morning with breakfast 90 tablet 3    ondansetron (ZOFRAN-ODT) 4 MG disintegrating tablet Take 1 tablet (4 mg) by mouth every 6 (six) hours as needed for Nausea 8 tablet 0    PARoxetine (PAXIL) 10 MG tablet Take 1 tablet (10 mg) by mouth every morning 60 tablet 0   [3] No Known Allergies  [4]   Past Surgical History:  Procedure Laterality Date    ABSCESS DRAINAGE      right neck    CYSTOSCOPY, URETEROSCOPY, LASER LITHOTRIPSY Left 11/29/2015    Procedure: CYSTOSCOPY, URETEROSCOPY, RETROGRADE PYELOGRAM  AND INSERTION OF LEFT DOUBLE  J STENT . ;  Surgeon: Prescilla Sours, MD;  Location: [facility];  Service: Urology;  Laterality: Left;  **IP-332-1/MD AVAIL 1600**    LITHOTRIPSY     [5] No family history on file.  [6]   Social History  Tobacco Use    Smoking status: Former     Types: Cigarettes    Smokeless tobacco: Never   Vaping Use    Vaping status: Never Used   Substance Use Topics    Alcohol use: Not Currently     Comment: Quit drinking 2 years ago    Drug use: No

## 2023-03-04 ENCOUNTER — Ambulatory Visit (INDEPENDENT_AMBULATORY_CARE_PROVIDER_SITE_OTHER): Payer: BC Managed Care – PPO | Admitting: Orthopaedic Surgery of the Spine

## 2023-03-04 ENCOUNTER — Encounter (INDEPENDENT_AMBULATORY_CARE_PROVIDER_SITE_OTHER): Payer: Self-pay | Admitting: Orthopaedic Surgery of the Spine

## 2023-03-04 VITALS — BP 131/85 | HR 85

## 2023-03-04 DIAGNOSIS — M4316 Spondylolisthesis, lumbar region: Secondary | ICD-10-CM

## 2023-03-04 DIAGNOSIS — M4727 Other spondylosis with radiculopathy, lumbosacral region: Secondary | ICD-10-CM

## 2023-03-04 DIAGNOSIS — M48062 Spinal stenosis, lumbar region with neurogenic claudication: Secondary | ICD-10-CM

## 2023-03-04 MED ORDER — CYCLOBENZAPRINE HCL 10 MG PO TABS
10.0000 mg | ORAL_TABLET | Freq: Three times a day (TID) | ORAL | 1 refills | Status: AC | PRN
Start: 2023-03-04 — End: ?

## 2023-03-04 MED ORDER — CELECOXIB 100 MG PO CAPS
200.0000 mg | ORAL_CAPSULE | Freq: Every day | ORAL | 3 refills | Status: AC
Start: 2023-03-04 — End: ?

## 2023-03-04 NOTE — Progress Notes (Signed)
[facility]   [name] [name]   [phone]  PROGRESS NOTE    Date Time: 03/04/2023  3:59 PM  Patient Name: [name] [name],[name] [name]  Referring Provider:     Chief Complaint     Chief Complaint   Patient presents with    Back Problem     Low back pain for years, pain radiates down both legs, and pain shoots up to his head and gets vertigo  Both feet go numb, and feels "electric shocks" going up and down legs       This is [name] follow-up    HPI:   [name] [name] is [name] 42 y.o. male who has [name] past medical history of  has [name] past medical history of Anxiety, Depression, Gout, Hyperlipidemia, and Myocardial infarction., presents to the office for evaluation of follow-up of continued lower lumbar radiculopathy with numbness and tingling into the legs      Patient denies any fever, dysuria, incontinence, or other bowel/bladder dysfunction.  Patient denies myelopathic symptoms    Patient denies any history of prior spinal surgery.    There is no height or weight on file to calculate BMI.    PMH:     Past Medical History:   Diagnosis Date    Anxiety     Depression     Gout     Hyperlipidemia     Myocardial infarction     cardiac arrect-seizure-MI 2021       PSH:    has [name] past surgical history that includes CYSTOSCOPY, URETEROSCOPY, LASER LITHOTRIPSY (Left, 11/29/2015); Lithotripsy; and Abscess drainage.    Allergies:   Allergies[1]    Medications:   Current Medications[2]    Social Hx:     Social History     Occupational History    Not on file   Tobacco Use    Smoking status: Former     Types: Cigarettes    Smokeless tobacco: Never   Vaping Use    Vaping status: Never Used   Substance and Sexual Activity    Alcohol use: Not Currently     Comment: Quit drinking 2 years ago    Drug use: No    Sexual activity: Not Currently       Family Hx:   Family History[3]   If not otherwise specified above means noncontributory.     ROS   General: negative for fever, chills, lethargy  Ophthalmic: negative for decreased, blurry or double vision  ENT:  negative for epistaxis, headaches, nasal discharge  Respiratory: negative for cough, pleuritic pain, shortness of breath,   Cardiovascular: negative for chest pain, edema, loss of consciousness, palpitations,  Gastrointestinal: negative for abdominal pain, nausea, vomiting, change in oral intake  Genitourinary: negative for pelvic pain or dysuria, urinary incontinence   Musculoskeletal: Per HPI  Neurological: negative for numbness, tingling, weakness, or visual changes  Dermatological: negative for rash    Physical Exam   BP 131/85   Pulse 85     Appearance:  Well kept, normally developed  Psych:  Alert and oriented x3, normal mood and affect  Eyes:  Anicteric  Cardiovascular:  No lower extremity edema, extremities warm  Respiratory:  Breathing unlabored  Skin: no rashes, incision   Musculoskeletal/Neurologic:   Neurological exam is unchanged from follow-up visit except no change          Review of Imaging Studies     MRI lumbar spine without contrast    Result Date: 02/08/2023  1.Degenerative changes at  the L4-L5 level resulting in marked spinal canal narrowing, narrowing of the lateral recesses, moderate narrowing of the left neural foramen, mild to moderate narrowing of the right neural foramen. 2.Degenerative changes at the L2-L3 level resulting in mild to moderate narrowing of the left neural foramen, mild narrowing of the right neural foramen, mild spinal canal narrowing. [name] Croak, MD 02/08/2023 1:14 PM     IMAGING: Lumbar MRI impression: Independently reviewed and shows: Grade 1 degenerative spondylolisthesis L4-5 with severe spinal stenosis     Assessment:   [name] [name] is [name] 42 y.o. male comes in for evaluation of lumbar radiculopathy and lumbar spondylolisthesis as seen on the MRI above we will start [name] conservative protocol with physical therapy anti-inflammatories including Celebrex muscle relaxants and epidural steroid injections he will follow-up after completion of the above-mentioned  treatment    [name] [name] was seen today for back problem.    Diagnoses and all orders for this visit:    Osteoarthritis of spine with radiculopathy, lumbosacral region  -     Ambulatory referral to Physical Therapy; Future  -     Referral to Pain Clinic; Future    Neurogenic claudication due to lumbar spinal stenosis  -     Ambulatory referral to Physical Therapy; Future  -     Referral to Pain Clinic; Future    Spondylolisthesis of lumbar region  -     Ambulatory referral to Physical Therapy; Future  -     Referral to Pain Clinic; Future    Other orders  -     cyclobenzaprine (FLEXERIL) 10 MG tablet; Take 1 tablet (10 mg) by mouth 3 (three) times daily as needed for Muscle spasms  -     celecoxib (CeleBREX) 100 MG capsule; Take 2 capsules (200 mg) by mouth daily         Plan:      Diagnosis ICD-10-CM Associated Order   1. Osteoarthritis of spine with radiculopathy, lumbosacral region  M47.27 Ambulatory referral to Physical Therapy     Referral to Pain Clinic      2. Neurogenic claudication due to lumbar spinal stenosis  M48.062 Ambulatory referral to Physical Therapy     Referral to Pain Clinic      3. Spondylolisthesis of lumbar region  M43.16 Ambulatory referral to Physical Therapy     Referral to Pain Clinic          Discussion and Plan as above, and evaluation regarding pain, symptoms and Imaging: Independently Reviewed with patient today - Yes  -  -    -    Thank you for allowing me to participate in your care.    Signed by: Rivka Safer, MD,   Please feel free to call or send [name] mychart for any questions:  (O)  [phone]       Due to the complex nature of this patient's problem, I spent [name] total of 45 minutes which was  preparing to see the patient on the day of the encounter by reviewing records and test results, including imaging review, counseling/educating the patient on maintaining [name] healthy weight, avoidance of smoking, pain management, ordering test including imaging studies, communications with other  health professionals, coordinating patient care and documenting information in the EMR. This also included any face to face time with the patient and the time was minus any procedures that were performed during the encounter that are separately reportable.    ________________________________________________________________________________________________________________________________________________  The review of the patient's medications  does not in any way constitute an endorsement, by this clinician,  of their use, dosage, indications, route, efficacy, interactions, or other clinical parameters.       [1] No Known Allergies  [2]   Current Outpatient Medications   Medication Sig Dispense Refill    acetaminophen (TYLENOL) 500 MG tablet Take 1 tablet (500 mg) by mouth      allopurinol (ZYLOPRIM) 100 MG tablet Take 1 tablet by mouth once daily 90 tablet 0    atorvastatin (LIPITOR) 10 MG tablet Take 1 tablet (10 mg) by mouth daily 90 tablet 3    meloxicam (MOBIC) 7.5 MG tablet Take 1 tablet (7.5 mg) by mouth daily      celecoxib (CeleBREX) 100 MG capsule Take 2 capsules (200 mg) by mouth daily 30 capsule 3    cyclobenzaprine (FLEXERIL) 10 MG tablet Take 1 tablet (10 mg) by mouth 3 (three) times daily as needed for Muscle spasms 90 tablet 1    metFORMIN (GLUCOPHAGE-XR) 500 MG 24 hr tablet Take 1 tablet (500 mg) by mouth every morning with breakfast (Patient not taking: Reported on 03/04/2023) 90 tablet 3    ondansetron (ZOFRAN-ODT) 4 MG disintegrating tablet Take 1 tablet (4 mg) by mouth every 6 (six) hours as needed for Nausea (Patient not taking: Reported on 03/04/2023) 8 tablet 0    PARoxetine (PAXIL) 10 MG tablet Take 1 tablet (10 mg) by mouth every morning (Patient not taking: Reported on 03/04/2023) 60 tablet 0     No current facility-administered medications for this visit.   [3] No family history on file.

## 2023-03-05 ENCOUNTER — Telehealth (INDEPENDENT_AMBULATORY_CARE_PROVIDER_SITE_OTHER): Payer: Self-pay

## 2023-03-05 NOTE — Telephone Encounter (Signed)
Patient called and requested a letter to state he can resume work with no restrictions.  I let him know letter will be done and he can find it under letters in mychart, voiced understanding and appreciation.

## 2023-03-18 ENCOUNTER — Encounter (INDEPENDENT_AMBULATORY_CARE_PROVIDER_SITE_OTHER): Payer: Self-pay | Admitting: Family Medicine

## 2023-03-22 ENCOUNTER — Encounter (INDEPENDENT_AMBULATORY_CARE_PROVIDER_SITE_OTHER): Payer: Self-pay | Admitting: Family Medicine

## 2023-03-29 ENCOUNTER — Other Ambulatory Visit (INDEPENDENT_AMBULATORY_CARE_PROVIDER_SITE_OTHER): Payer: Self-pay | Admitting: Family Medicine

## 2023-03-29 DIAGNOSIS — M1A0790 Idiopathic chronic gout, unspecified ankle and foot, without tophus (tophi): Secondary | ICD-10-CM

## 2023-03-31 ENCOUNTER — Other Ambulatory Visit (FREE_STANDING_LABORATORY_FACILITY): Payer: Self-pay | Admitting: Student in an Organized Health Care Education/Training Program

## 2023-03-31 LAB — VARICELLA ZOSTER VIRUS (VZV) ANTIBODY, IGG: Varicella Zoster Virus Antibody, IgG: 11.9 {s_co_ratio} (ref >=1.000)

## 2023-03-31 LAB — HEPATITIS B SURFACE (HBV) ANTIBODY, QUANTITATIVE: Hepatitis B Surface Antibody: 72.24 m[IU]/mL

## 2023-04-02 LAB — QUANTIFERON(R) - TB GOLD PLUS
Mitogen-NIL: 8.64 [IU]/mL
NIL: 0.01 [IU]/mL
Quantiferon TB Gold Plus: NEGATIVE
TB1-NIL: 0 [IU]/mL
TB2-NIL: 0.01 [IU]/mL

## 2023-04-07 ENCOUNTER — Ambulatory Visit
Admission: RE | Admit: 2023-04-07 | Discharge: 2023-04-07 | Disposition: A | Discharge status: Home / Self Care | Payer: Self-pay | Source: Ambulatory Visit | Attending: Physical Therapist | Admitting: Physical Therapist

## 2023-04-07 DIAGNOSIS — M549 Dorsalgia, unspecified: Secondary | ICD-10-CM | POA: Insufficient documentation

## 2023-04-07 NOTE — PT Eval Note (Signed)
 [facility]  Outpatient Physical Therapy Spine Evaluation    Please cosign/sign and fax [phone]) this note indicating that you are in agreement with PT plan of care for insurance authorization so that we may initiate therapy treatment.

## 2023-05-03 ENCOUNTER — Ambulatory Visit
Admission: RE | Admit: 2023-05-03 | Discharge: 2023-05-03 | Disposition: A | Discharge status: Home / Self Care | Payer: Self-pay | Source: Ambulatory Visit | Attending: Physical Therapist | Admitting: Physical Therapist

## 2023-05-03 NOTE — PT Plan of Care Note (Signed)
[facility]  Outpatient Physical Therapy Cancellation Note    Patient:  [name] [name] MRN#:  [id number]  Date: 05/03/2023      Patient cancelled Outpatient Physical Therapy appointment on 05/03/2023 due to no reason given.     Call made by PT and rescheduled pt for 12/10.     According to our [facility] Physical Medicine Department's 24 hour cancellation policy, patient's notification of cancellation is considered a no show for 05/03/2023 appointment.   Pt was provided opportunity to reschedule appointment to meet patient's needs.       Melene Plan PT, DPT  [place] License #[id number]  [facility]  MWF 10-6:30, TTh 9-5:30  [phone]  05/03/2023 5:33 PM

## 2023-05-04 ENCOUNTER — Ambulatory Visit: Admission: RE | Admit: 2023-05-04 | Payer: Self-pay | Source: Ambulatory Visit

## 2023-05-10 ENCOUNTER — Ambulatory Visit: Payer: Self-pay

## 2023-10-02 ENCOUNTER — Other Ambulatory Visit (INDEPENDENT_AMBULATORY_CARE_PROVIDER_SITE_OTHER): Payer: Self-pay | Admitting: Family Medicine

## 2023-10-02 DIAGNOSIS — F330 Major depressive disorder, recurrent, mild: Secondary | ICD-10-CM

## 2023-11-06 ENCOUNTER — Other Ambulatory Visit (INDEPENDENT_AMBULATORY_CARE_PROVIDER_SITE_OTHER): Payer: Self-pay | Admitting: Family Medicine

## 2024-01-07 ENCOUNTER — Other Ambulatory Visit (INDEPENDENT_AMBULATORY_CARE_PROVIDER_SITE_OTHER): Payer: Self-pay | Admitting: Family Medicine

## 2024-01-07 DIAGNOSIS — M5440 Lumbago with sciatica, unspecified side: Secondary | ICD-10-CM

## 2024-01-07 DIAGNOSIS — M25511 Pain in right shoulder: Secondary | ICD-10-CM

## 2024-01-07 DIAGNOSIS — G8929 Other chronic pain: Secondary | ICD-10-CM

## 2024-02-25 ENCOUNTER — Other Ambulatory Visit (INDEPENDENT_AMBULATORY_CARE_PROVIDER_SITE_OTHER): Payer: Self-pay | Admitting: Family Medicine

## 2024-04-08 ENCOUNTER — Other Ambulatory Visit (INDEPENDENT_AMBULATORY_CARE_PROVIDER_SITE_OTHER): Payer: Self-pay | Admitting: Family Medicine

## 2024-04-08 DIAGNOSIS — G8929 Other chronic pain: Secondary | ICD-10-CM

## 2024-04-08 DIAGNOSIS — M25561 Pain in right knee: Secondary | ICD-10-CM

## 2024-04-10 ENCOUNTER — Other Ambulatory Visit: Payer: Self-pay | Admitting: Physical Medicine & Rehabilitation

## 2024-04-10 DIAGNOSIS — M5010 Cervical disc disorder with radiculopathy, unspecified cervical region: Secondary | ICD-10-CM

## 2024-05-01 ENCOUNTER — Encounter (INDEPENDENT_AMBULATORY_CARE_PROVIDER_SITE_OTHER): Payer: Self-pay | Admitting: Family Medicine

## 2024-05-06 ENCOUNTER — Ambulatory Visit
